# Patient Record
Sex: Female | Born: 1950 | Race: White | Hispanic: No | Marital: Married | Smoking: Current every day smoker
Health system: Southern US, Community
[De-identification: ages and names within clinical notes are randomized; demographics above are authoritative.]

## PROBLEM LIST (undated history)

## (undated) DIAGNOSIS — F32A Depression, unspecified: Secondary | ICD-10-CM

## (undated) DIAGNOSIS — I219 Acute myocardial infarction, unspecified: Secondary | ICD-10-CM

## (undated) DIAGNOSIS — Q444 Choledochal cyst: Secondary | ICD-10-CM

## (undated) DIAGNOSIS — N183 Chronic kidney disease, stage 3 unspecified: Secondary | ICD-10-CM

## (undated) DIAGNOSIS — K635 Polyp of colon: Secondary | ICD-10-CM

## (undated) DIAGNOSIS — K219 Gastro-esophageal reflux disease without esophagitis: Secondary | ICD-10-CM

## (undated) DIAGNOSIS — G4730 Sleep apnea, unspecified: Secondary | ICD-10-CM

## (undated) DIAGNOSIS — F419 Anxiety disorder, unspecified: Secondary | ICD-10-CM

## (undated) DIAGNOSIS — R278 Other lack of coordination: Secondary | ICD-10-CM

## (undated) DIAGNOSIS — K319 Disease of stomach and duodenum, unspecified: Secondary | ICD-10-CM

## (undated) DIAGNOSIS — I10 Essential (primary) hypertension: Secondary | ICD-10-CM

## (undated) DIAGNOSIS — F329 Major depressive disorder, single episode, unspecified: Secondary | ICD-10-CM

## (undated) DIAGNOSIS — E785 Hyperlipidemia, unspecified: Secondary | ICD-10-CM

## (undated) DIAGNOSIS — J449 Chronic obstructive pulmonary disease, unspecified: Secondary | ICD-10-CM

## (undated) HISTORY — DX: Hyperlipidemia, unspecified: E78.5

## (undated) HISTORY — DX: Gastro-esophageal reflux disease without esophagitis: K21.9

## (undated) HISTORY — DX: Chronic obstructive pulmonary disease, unspecified: J44.9

## (undated) HISTORY — DX: Disease of stomach and duodenum, unspecified: K31.9

## (undated) HISTORY — DX: Acute myocardial infarction, unspecified: I21.9

## (undated) HISTORY — PX: ABDOMINAL HYSTERECTOMY: SHX81

## (undated) HISTORY — DX: Choledochal cyst: Q44.4

## (undated) HISTORY — DX: Sleep apnea, unspecified: G47.30

## (undated) HISTORY — DX: Other lack of coordination: R27.8

## (undated) HISTORY — DX: Depression, unspecified: F32.A

## (undated) HISTORY — DX: Anxiety disorder, unspecified: F41.9

## (undated) HISTORY — PX: INGUINAL HERNIA REPAIR: SUR1180

## (undated) HISTORY — DX: Polyp of colon: K63.5

## (undated) HISTORY — PX: BLADDER SUSPENSION: SHX72

## (undated) HISTORY — DX: Essential (primary) hypertension: I10

---
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
## 2004-05-09 HISTORY — PX: CORONARY STENT PLACEMENT: SHX1402

## 2006-05-09 DIAGNOSIS — C3490 Malignant neoplasm of unspecified part of unspecified bronchus or lung: Secondary | ICD-10-CM

## 2006-05-09 HISTORY — DX: Malignant neoplasm of unspecified part of unspecified bronchus or lung: C34.90

## 2007-05-10 HISTORY — PX: LUNG LOBECTOMY: SHX167

## 2011-07-31 HISTORY — PX: UPPER ESOPHAGEAL ENDOSCOPIC ULTRASOUND (EUS): SHX6562

## 2013-01-16 HISTORY — PX: UPPER ESOPHAGEAL ENDOSCOPIC ULTRASOUND (EUS): SHX6562

## 2015-12-07 HISTORY — PX: ANORECTAL MANOMETRY: SHX298

## 2015-12-28 HISTORY — PX: UPPER ESOPHAGEAL ENDOSCOPIC ULTRASOUND (EUS): SHX6562

## 2015-12-28 HISTORY — PX: ESOPHAGOGASTRODUODENOSCOPY: SHX1529

## 2016-05-09 DIAGNOSIS — K52831 Collagenous colitis: Secondary | ICD-10-CM

## 2016-05-09 HISTORY — DX: Collagenous colitis: K52.831

## 2016-08-18 HISTORY — PX: COLONOSCOPY W/ BIOPSIES: SHX1374

## 2018-01-29 HISTORY — PX: UPPER ESOPHAGEAL ENDOSCOPIC ULTRASOUND (EUS): SHX6562

## 2018-09-07 DIAGNOSIS — C3490 Malignant neoplasm of unspecified part of unspecified bronchus or lung: Secondary | ICD-10-CM

## 2018-09-07 HISTORY — DX: Malignant neoplasm of unspecified part of unspecified bronchus or lung: C34.90

## 2019-03-21 LAB — CLOSTRIDIUM DIFFICILE BY PCR

## 2019-04-09 ENCOUNTER — Encounter: Payer: Self-pay | Admitting: Internal Medicine

## 2019-04-21 ENCOUNTER — Encounter: Payer: Self-pay | Admitting: Gastroenterology

## 2019-04-21 NOTE — Progress Notes (Signed)
Referring Provider:  Bonney Leitz, MD Primary Care Physician:  Bonney Leitz, MD Primary Gastroenterologist:  Dr. Gala Romney  Chief Complaint  Patient presents with  . Diarrhea  . Hemorrhoids    some bleeding    HPI:   Carolyn Drake is a 68 y.o. female presenting today at the request of Dr. Laurena Slimmer, patients oncologist, for colitis.   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  Referred for biofeedback therapy but patient declined.  History of chronic diarrhea.  GI pathogen panel and C. difficile negative in 2017.  Colonoscopy in April 2018 for diarrhea.  Biopsy showed collagenous colitis.  Tried to start patient on Lialda but was not covered by insurance.  Balsalazide prescribed but also not covered by insurance although in chart review, it appeared she tried Balsalazide for 1 week and it caused worsening diarrhea.  Budesonide too expensive, Bentyl not effective.  She had been maintaining on Imodium 6 mg twice daily.  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  This has been followed by EUS since 2013.  Most recent EUS on 01/29/2018 with Dr. Newman Pies.  I am unable to review results but according to Dr. 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  Abdominal US 02/2016 for nausea and vomiting.  No acute findings in the abdomen.   Most recently saw Dr. Arsenio Loader with [facility] on 06/27/18 .  She was given a printed prescription of balsalazide to see if she could obtain this out of [place] in a more affordable way.  Advised her to remain on Imodium 6 mg twice a day, and prescribed Cipro 500 mg twice daily for 14 days for empiric treatment of SIBO.  Reviewed referral noted from Dr. Junius Roads with [facility].  Patient  follows with Dr. 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 Per his note, with treatment, survival is about 1.5 years. She was intolerant to chemotherapy and desired to try immune therapy alone.  She was continued on Keytruda.  Apparently had PET scan in September 2020.  Per oncology notes, this showed stable disease.  Stated the rectal activity could be due to hemorrhoids and colitis.  Note on 03/20/2019 stated colitis worsening with the start of Keytruda.  Beryle Flock was held and prescription of prednisone for 2 weeks given.  C. difficile was also checked and patient was started on Lomotil.  C. difficile was negative.  Note on 04/02/2019 stated patient continued with diarrhea and was using 2 tablets of Lomotil a day.  Reported blood in her stool more on wiping but it also drips from her hemorrhoids.  Pain in the right lower part of abdomen. Nausea improved with Ativan.  Labs at that time with elevated LFTs with plans to hold immune therapy.  Prescription for 60 mg prednisone x2 weeks followed by 40 mg x 1 week and then follow-up in clinic.  Also with development of hypothyroidism with TSH 102 and acute kidney injury with creatinine 1.44.  Patients lomotil was increased to 4 times daily. Plans stated if her labs do not improve in 1 week, would do CT of the abdomen and pelvis.  Today:  Diarrhea is better than it had been. Up until  last couple of weeks, she had no control over it. Dr. Junius Roads took her off imodium and started Lomotil. Last took [place] about 1.5-2 months ago. Completed steroids prescribed by Dr. Junius Roads a couple days ago. States mouth broke out with fungal infection. She has completed 3 different antibiotics and a mouth wash which is helping. Taking lomotil twice a day. Basalazide did not work well in the past. She did not get the prescription filled from Dr. Arsenio Loader that he recommended trying to obtain from [place].   On average, having 2-3 BMs daily. Always mushy to watery.  Prior to 2 weeks ago, she was having a BM every hour to two day and night.  Had been losing weight. Since being on steroids, she has gained about 5 lbs. Admits to bright red blood in the stool. Sometimes just drips in the toilet water. Occurs most every day. Has been present for years. Uses preparation H. This helps some. Stopped bleeding for a short time. Reports hemorrhoids on the inside and outside. No rectal pain. No burning, itching, or stinging in rectum. No prior hemorrhoid treatment. No black stools.   Abdominal pain. Started 6 months ago. RLQ and midline lower abdomen. No identified aggravating or relieving factors. At times pain is 5-6/10, other times it is about 2/10. Pain is always there. Not sure if she has her appendix. Denies urinary symptoms. Urine sometimes gets darker. Denies abdominal swelling, swelling in lower extremities, confusion, yellowing of eyes or skin.  No history of drug use.  No alcohol use.  Intermittent nausea. Occasional vomiting. Ativan helps. Reflux daily on omeprazole BID. Has been on omeprazole for years. No NSAIDs. Denies dysphagia.   Has not repeated labs with Dr. Junius Roads yet to follow-up on LFTs etc. She has appointment with him this week for follow-up.    Past Medical History:  Diagnosis Date  . Adenocarcinoma of lung, stage 4 ([place]) 09/2018  . Anxiety   . Collagenous colitis   . Colon polyp   . COPD (chronic obstructive pulmonary disease) ([place])   . Depression   . 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 GERD (gastroesophageal reflux disease)   . HLD (hyperlipidemia)   . HTN (hypertension)   . Lesion of stomach    followed by routine EUS, last in 2019  . Lung cancer ([place]) 2008   s/p resenction  . Myocardial infarction ([place])   . Sleep apnea     Past Surgical History:  Procedure Laterality Date  . ABDOMINAL HYSTERECTOMY    . BLADDER SUSPENSION    . COLONOSCOPY  2018   [facility]: path: collagenous colitis  .  CORONARY STENT PLACEMENT  2006  . 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 Most recent on 01/29/2018 with Dr. Newman Pies.   . INGUINAL HERNIA REPAIR    . LUNG LOBECTOMY Right 2009   upper lobe    Current Outpatient Medications  Medication Sig Dispense Refill  . albuterol (VENTOLIN HFA) 108 (90 Base) MCG/ACT inhaler Inhale 2 puffs into the lungs every 6 (six) hours as needed.    Marland Kitchen aspirin 81 MG EC tablet Take 1 tablet by mouth daily.    Marland Kitchen atorvastatin (LIPITOR) 40 MG tablet Take 1 tablet by mouth daily.    . baclofen (LIORESAL) 10 MG tablet Take 1 tablet by mouth 3 (three) times daily as needed.    . diphenoxylate-atropine (LOMOTIL) 2.5-0.025 MG tablet Take 1 tablet  by mouth every 6 (six) hours as needed.    Marland Kitchen escitalopram (LEXAPRO) 20 MG tablet Take 1 tablet by mouth daily.    Marland Kitchen gabapentin (NEURONTIN) 600 MG tablet Take 1 tablet by mouth 2 (two) times daily.    Marland Kitchen levothyroxine (SYNTHROID) 25 MCG tablet Take 1 tablet by mouth daily.    Marland Kitchen LORazepam (ATIVAN) 0.5 MG tablet Take 1 tablet by mouth 2 (two) times daily as needed.    . metoprolol tartrate (LOPRESSOR) 50 MG tablet Take 1 tablet by mouth 2 (two) times daily.    Marland Kitchen nystatin (MYCOSTATIN) 100000 UNIT/ML suspension Take 5 mLs by mouth every 6 (six) hours.    Marland Kitchen omeprazole (PRILOSEC) 20 MG capsule Take 1 capsule by mouth 2 (two) times daily.    . promethazine (PHENERGAN) 25 MG tablet Take 1 tablet by mouth every 6 (six) hours as needed.    . rizatriptan (MAXALT-MLT) 10 MG disintegrating tablet Take 1 tablet by mouth as needed.    . traMADol (ULTRAM) 50 MG tablet Take 1 tablet by mouth. 1-2 times daily    . traZODone (DESYREL) 50 MG tablet Take 1 tablet by mouth at bedtime.    . hydrocortisone (ANUSOL-HC) 2.5 % rectal cream Place 1 application rectally 2 (two) times daily. 30 g 1  . pantoprazole (PROTONIX) 40 MG tablet Take 1 tablet (40 mg total) by mouth 2 (two) times daily before a meal. 60 tablet 5   No current  facility-administered medications for this visit.    Allergies as of 04/22/2019 - Review Complete 04/22/2019  Allergen Reaction Noted  . Lisinopril Cough 04/22/2019    Family History  Problem Relation Age of Onset  . Lung cancer Mother   . Congestive Heart Failure Father     Social History   Socioeconomic History  . Marital status: Married    Spouse name: Not on file  . Number of children: Not on file  . Years of education: Not on file  . Highest education level: Not on file  Occupational History  . Not on file  Tobacco Use  . Smoking status: Current Every Day Smoker    Types: Cigarettes  . Smokeless tobacco: Never Used  Substance and Sexual Activity  . Alcohol use: Not Currently  . Drug use: Not Currently  . Sexual activity: Not on file  Other Topics Concern  . Not on file  Social History Narrative  . Not on file   Social Determinants of Health   Financial Resource Strain:   . Difficulty of Paying Living Expenses: Not on file  Food Insecurity:   . Worried About Charity fundraiser in the Last Year: Not on file  . Ran Out of Food in the Last Year: Not on file  Transportation Needs:   . Lack of Transportation (Medical): Not on file  . Lack of Transportation (Non-Medical): Not on file  Physical Activity:   . Days of Exercise per Week: Not on file  . Minutes of Exercise per Session: Not on file  Stress:   . Feeling of Stress : Not on file  Social Connections:   . Frequency of Communication with Friends and Family: Not on file  . Frequency of Social Gatherings with Friends and Family: Not on file  . Attends Religious Services: Not on file  . Active Member of Clubs or Organizations: Not on file  . Attends Archivist Meetings: Not on file  . Marital Status: Not on file  Intimate Partner Violence:   .  Fear of Current or Ex-Partner: Not on file  . Emotionally Abused: Not on file  . Physically Abused: Not on file  . Sexually Abused: Not on file     Review of Systems: Gen: Denies any fever, chills, lightheadedness, pre-syncope, or syncope.   CV: Denies chest pain or heart palpitations.  Resp: Chronic shortness of breath with exertion. None at rest. On 2 L nasal canula at night.  GI: See HPI. GU : Denies urinary burning, urinary frequency, urinary hesitancy MS: Denies joint pain Derm: Denies rash Psych: Admits to depression  Heme: See HPI  Physical Exam: BP (!) 158/83   Pulse 69   Temp (!) 96.9 F (36.1 C) (Temporal)   Ht 5' 3"  (1.6 m)   Wt 121 lb 9.6 oz (55.2 kg)   BMI 21.54 kg/m  General:   Alert and oriented. Pleasant and cooperative. Well-nourished and well-developed.  Head:  Normocephalic and atraumatic. Eyes:  Without icterus, sclera clear and conjunctiva pink.  Ears:  Normal auditory acuity. Lungs:  Clear to auscultation bilaterally. No wheezes, rales, or rhonchi. No distress.  Heart:  S1, S2 present without murmurs appreciated.  Abdomen:  +BS, soft, non-distended.  Mild tenderness to palpation in the right lower quadrant/midline lower abdomen.  No HSM noted. No guarding or rebound. No masses appreciated.  Rectal:  Deferred  Msk:  Symmetrical without gross deformities. Normal posture. Extremities:  Without edema. Neurologic:  Alert and  oriented x4;  grossly normal neurologically. Skin:  Intact without significant lesions or rashes. Psych: Normal mood and affect.  Outside Labs: 04/02/2019 Magnesium 1.8 CMP: Creatinine 1.44 (H), sodium 139, potassium 4.1, chloride 109 (H), glucose 84, calcium 8.9, total protein 6.3 (L), albumin 3.2 (L), total bilirubin 0.4, AST 162 (H), ALT 286 (H), alk phos 111 TSH 102 (H), free T4 0.32 (L) CMP: WBC 9.8, hemoglobin 11.4 (L), MCV 103 (H), MCH 31.5, MCHC 30.6 (L), platelets 230  Prior GI Eval/Procedures (obtained from Dr. Arsenio Loader notes as I am not see results in the system- will request records to verify): EUS 08/03/11: 1.5cm stomach submucosal lesion in the body lesser  curve  EUS 01/16/13: 75m by 160mstomach submucosal lesion in the stomach body greater curve- no change in size  EUS 12/28/15: EGD Findings: 1. A gastric subepithelial lesion measuring about 1.5 cm was seen in the distal gastric body, along the greater curvature of the stomach. Multiple biopsies were obtained 2. An erythematous mucosal fold was seen in the duodenal bulb. Biopsies were Obtained  EUS 01/29/2018 Gastric lesion 15 x 11 mm. 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  Colonoscopy 08/18/16: Normal colonoscopy COLON, "RANDOM," BIOPSY: Collagenous colitis.  Anorectal manometry 12/07/15:  Normal resting anal sphincter pressure with normal squeeze and intact recto-anal inhibitory reflex.  Sensation occurs at 70 cc and urge to defecate occurs at 100 cc, suggesting mild rectal hypersensitivity.  Normal rectal propulsive pressure with paradoxical increase in anal pressure during attempted defecation, which yields a negative recto-anal pressure differential, characteristic of type III dyssynergia.  HBT (dextrose) 12/09/16: No significant change in low level hydrogen or methane production over 2 hours. Bacterial overgrowth is not present.

## 2019-04-22 ENCOUNTER — Encounter: Payer: Self-pay | Admitting: Gastroenterology

## 2019-04-22 ENCOUNTER — Ambulatory Visit (INDEPENDENT_AMBULATORY_CARE_PROVIDER_SITE_OTHER): Payer: Medicare Other | Admitting: Gastroenterology

## 2019-04-22 ENCOUNTER — Encounter: Payer: Self-pay | Admitting: *Deleted

## 2019-04-22 ENCOUNTER — Other Ambulatory Visit: Payer: Self-pay

## 2019-04-22 DIAGNOSIS — R109 Unspecified abdominal pain: Secondary | ICD-10-CM | POA: Insufficient documentation

## 2019-04-22 DIAGNOSIS — K219 Gastro-esophageal reflux disease without esophagitis: Secondary | ICD-10-CM

## 2019-04-22 DIAGNOSIS — R7989 Other specified abnormal findings of blood chemistry: Secondary | ICD-10-CM

## 2019-04-22 DIAGNOSIS — K625 Hemorrhage of anus and rectum: Secondary | ICD-10-CM

## 2019-04-22 DIAGNOSIS — R197 Diarrhea, unspecified: Secondary | ICD-10-CM

## 2019-04-22 MED ORDER — HYDROCORTISONE (PERIANAL) 2.5 % EX CREA: 1.0000 "application " | TOPICAL_CREAM | Freq: Two times a day (BID) | CUTANEOUS | 1 refills | Status: DC

## 2019-04-22 MED ORDER — PANTOPRAZOLE SODIUM 40 MG PO TBEC: 40.0000 mg | DELAYED_RELEASE_TABLET | Freq: Two times a day (BID) | ORAL | 5 refills | Status: DC

## 2019-04-22 NOTE — Patient Instructions (Addendum)
I am placing an order for an ultrasound of your liver and gallbladder.  You should have this done in the next week or so.  Please stop omeprazole and start Protonix. I am sending in Protonix 40 mg.  You will take this twice daily 30 minutes before breakfast and dinner.  I am sending in a prescription for Anusol cream.  You will apply this twice daily per your rectum for 10-14 days for your hemorrhoids.  I am requesting records from [facility] health and Dr. Elwin Sleight.  I am going to discuss your case further with Dr. Gala Romney. Further recommendations to follow.   Aliene Altes, PA-C [facility]

## 2019-04-25 ENCOUNTER — Encounter: Payer: Self-pay | Admitting: Gastroenterology

## 2019-04-25 DIAGNOSIS — R7989 Other specified abnormal findings of blood chemistry: Secondary | ICD-10-CM | POA: Insufficient documentation

## 2019-04-25 NOTE — Assessment & Plan Note (Addendum)
Labs on 04/02/2019 with AST elevated at 162 and ALT elevated at 286.  Alk phos normal at 111.  Total bilirubin normal at 0.4.  Suspect this is likely secondary to Keytruda (immune therapy with side effects of AST, ALT, alk phos increase, and hepatitis) which patient was recently started on in June 2020 for recurrent stage IV bilateral lung adenocarcinoma.  LFTs in November 2019 were normal.  No recent imaging of the liver on file visible to me.  She did have PET scan in September 2020 that I cannot view.  Per oncology notes, this showed stable disease.  She denies history of liver disease.  She is without signs or symptoms of advanced liver disease.  Denies history of drug use or alcohol use.  Per last oncology note on 04/02/2019, plans were to hold Keytruda and if labs not improving would order CT abdomen and pelvis. Patient has follow-up with oncology this week and will have labs at that time.   Order RUQ US Follow-up on labs from Dr. Madan.  

## 2019-04-25 NOTE — Assessment & Plan Note (Signed)
Chronic. Not adequately controlled on omeprazole 20 mg BID for which she has been on for years. Will stop omeprazole and start protonix 40 mg BID.

## 2019-04-25 NOTE — Assessment & Plan Note (Signed)
Patient reports intermittent bright red blood per rectum for years with known hemorrhoids.  Currently, bright red blood in stool and occasionally drips into the toilet water occurring most every day.  Using Preparation H which helps some.  Denies rectal pain, burning, itching, or melena.  Admits to constant right lower quadrant and midline lower abdominal pain x6 months without aggravating or relieving factors. She does have history significant for collagenous colitis with colonoscopy in 2018 with [facility] (can not reviewed full report).  Also with recurrent stage IV bilateral lung cancer diagnosed in May 2020.  Oncology notes state with treatment, survival is about 1.5 years with treatment and less than 6 months without treatment.  Currently on immunotherapy alone as she did not tolerate chemo.  She did have a PET scan in September 2020; I cannot see this report.  Per oncology notes, this showed stable disease.  Stated the "rectal activity could be due to hemorrhoids and colitis."   Suspect rectal bleeding is likely due to hemorrhoids at this is a chronic problem and she had colonoscopy in 2018.  Would not expect this to be from her collagenous colitis.  Cannot rule out metastatic disease.  Discussed ordering CT abdomen and pelvis to evaluate her right lower quadrant pain.  Patient states she supposed to have PET scan in December and wants to follow-up with Dr. Junius Roads this week before ordering other scans.  Sending in prescription for Anusol cream.  She is to use this twice daily x10-14 days and as needed. Request records from [facility] and Dr. Junius Roads.  Will discuss case further with Dr. Gala Romney and have further recommendations to follow.

## 2019-04-25 NOTE — Assessment & Plan Note (Addendum)
History of chronic diarrhea in the setting of collagenous colitis previously followed by [facility].  Colonoscopy in 2018 with [facility] with biopsies confirming collagenous colitis.  Cannot see the full report.  Historically this has been difficult to manage.  Reviewed GI notes.  Apparently budesonide too expensive, Bentyl not effective, Lialda not covered by insurance, balsalazide not well covered by insurance although patient apparently tried this in the past and states it did not work well.  Had been maintaining on Imodium.  More recently, Dr. Junius Roads, patients's oncologist, stopped Imodium and started patient on Lomotil.  This was working fairly well until she started [facility] for recurrent bilateral stage IV adenocarcinoma of the lung which worsened her colitis.  C. difficile was negative.  Beryle Flock was held and patient completed 3 weeks of steroids.  Prior to steroids, patient was having watery BMs every 1-2 hours day and night.  Since steroids, diarrhea is much improved only having 2-3 mushy to watery BMs daily and using Lomotil twice daily. Was losing weight, but has gained 5 lbs since steroids. Admit to chronic intermittent rectal bleeding, currently occurring about every day with Preparation H helping some.  Also with 61-month history of constant right lower quadrant, mid lower abdominal pain as discussed above.  PET scan and September 2020.  Cannot review results but per oncology notes this showed stable disease.  They did state rectal activity could be due to hemorrhoids and colitis. Recent hemoglobin on 04/02/19, 11.4 (up from 9 in July). She is supposed to have labs with Dr. Junius Roads this week.    Suspect diarrhea is likely secondary to her collagenous colitis especially as this has improved with steroids. Also influenced by [facility]. Query whether she may benefit from additional budesonide therapy.  We may be able to get this compounded at a lower price at [facility].  Rectal  bleeding likely secondary to known hemorrhoids as this is chronic; however, with recurrent lung cancer and right lower quadrant pain, metastatic disease is on the differential.  Discussed CT abdomen and pelvis with patient, but she is to have a PET scan later this month and wants to follow-up with Dr. Junius Roads this week before ordering other scans. Not sure of the role of repeat colonoscopy at this time. Will discuss case further with Dr. Gala Romney.  Anusol cream BID x 10-14 days for rectal bleeding.  Continue Lomotil.  Request records from Dr. Junius Roads including PET scan results and labs this week.  Request records from [facility].  Further recommendations to follow.

## 2019-04-25 NOTE — Assessment & Plan Note (Addendum)
48-month history of constant right lower quadrant and midline lower abdominal pain without identified aggravating or relieving factors.  Ranges from 2/10 in severity to 5-6/10.  Not sure if she has her appendix.  Denies urinary symptoms.  She does have chronic diarrhea with known collagenous colitis s/p recent steroids with improvement.  Chronic intermittent rectal bleeding with known hemorrhoids per patient, currently occurring with most bowel movements.  Preparation H helping some.  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  Abdominal exam with mild tenderness to palpation in the RLQ/midline lower abdomen.   As this has been going on for 6 months, would not expect an acute etiology such as appendicitis.  With history of recurrent stage IV lung cancer, there is some concern for metastatic disease.  Discussed CT abdomen and pelvis with patient.  She states she has PET scan later this month and wants to follow-up with Dr. Junius Roads this week before pursuing other scans.  She was advised to continue to monitor her symptoms and to let us know if her abdominal pain worsened.

## 2019-04-26 ENCOUNTER — Telehealth: Payer: Self-pay | Admitting: Gastroenterology

## 2019-04-26 NOTE — Telephone Encounter (Signed)
Manuela Schwartz, I see we requested records from [facility]. I am also needing records from Dr. Junius Roads who is with [facility] in [place], [place]. Need patients recent lab work and imaging records including PET scan.

## 2019-04-29 ENCOUNTER — Encounter: Payer: Self-pay | Admitting: Gastroenterology

## 2019-04-30 ENCOUNTER — Telehealth: Payer: Self-pay | Admitting: Gastroenterology

## 2019-04-30 ENCOUNTER — Encounter: Payer: Self-pay | Admitting: Internal Medicine

## 2019-04-30 NOTE — Telephone Encounter (Signed)
Discussed case with Dr. Gala Romney. Recommended treating collagenous colitis with 9 mg Budesonide daily x 8 weeks and follow up in clinic to discuss taper. Suspect rectal bleeding will improve with improvement of diarrhea. No colonoscopy at this time.   Spoke with patient. Would not be able to afford budesonide through insurance. Discussed obtaining compounded Budesonide from [place]. Discussed price of about $60 for 4 weeks, so total of about $120. Patient was agreeable.   Called [employer]. They will compound Budesonide. Prescription for 9 mg daily x 8 weeks. Cost will be about $100.   Stacey: Can you make patient a follow-up appointment for week of February 15th 2021? F/U on collagenous colitis.

## 2019-04-30 NOTE — Telephone Encounter (Signed)
Noted  

## 2019-04-30 NOTE — Telephone Encounter (Signed)
Patient's daughter called to discuss further. Carolyn Drake transferred call to me. Spoke with patient's daughter, Carolyn Drake. She was concerned about the cost of the Budesonide. States her mom can't hear well and wanted to call and discuss the need for Budesonide and the cost. I explained to the daughter that the patient has never been adequately treated for collagenous colitis and Budesonide was the best choice in therapy as she had been tried on other medications in the past without improvement. Explained that Budesonide may not be something that is a chronic medication but we would treat for 8 weeks, reevaluate, and hopefully plan to taper the dose if she responded well. Daughter stated patient will likely restart Keytruda soon and was concerned her diarrhea would worsen. This is a possibility, but we need to go ahead and get on top of treatment so hopefully her diarrhea doesn't get as severe as it did in the past with Keytruda. Daughter stated she will discuss with her mom further. If they decide to move forward with picking up budesonide, they will let us know.   Hilliard Clark

## 2019-04-30 NOTE — Telephone Encounter (Signed)
PATIENT SCHEDULED FOR THAT WEEK AND LETTER SENT

## 2019-04-30 NOTE — Telephone Encounter (Signed)
I've faxed another request from records

## 2019-05-06 ENCOUNTER — Ambulatory Visit (HOSPITAL_COMMUNITY): Admission: RE | Admit: 2019-05-06 | Payer: Medicare Other | Source: Ambulatory Visit

## 2019-05-12 ENCOUNTER — Telehealth: Payer: Self-pay | Admitting: Gastroenterology

## 2019-05-12 NOTE — Telephone Encounter (Addendum)
Received and reviewed labs and recent PET scan from Dr. Junius Roads (oncologist)   Labs: CMP: BUN 18, creatinine 1.08 (H), sodium 136, potassium 3.7, glucose 102, calcium 8.9, total protein 6.2 (L), albumin 6.3, total bilirubin 0.5, AST 29, ALT 106 (H), alk phos 91 CBC: WBC 9.87, hemoglobin 11.3 (L), hematocrit 36.0, MCV 100.8 (H), MCH 31.7, MCHC 31.4, platelets 232 Magnesium 1.8 TSH 22.4 (H) Free T4 0.42 (L)  PET/CT skull-mid thigh 01/23/2019 Impression: Stable appearing foci of moderate to intense FDG avidity within the bilateral upper lobes corresponded with bilateral solid and groundglass opacities.  These are likely secondary to malignancy.  Nonspecific intense uptake within the anterior tongue, likely odontogenic in nature.  New nonspecific contiguous intense FDG uptake at the level of the rectum.  This could be related to bowel uptake or secondary to inflammation or infection.  LFTs are much improved compared to November's labs (AST 162, ALT 286, alk phos 111).  As discussed at office visit, I suspect her elevated LFTs were likely secondary to [facility] which was being held and patient received steroids.  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 No need for significant workup at this time. I did order RUQ [place] which patient hasn't completed.   Rectal activity on PET scan may be related to colitis/ possible hemorrhoids.   Alicia: Please let patient know I have received and reviewed labs/PET scan/recent oncology note. LFTs improving. Elevation likely secondary to [facility]. I did order RUQ [place] which she hasn't completed.   Additionally, I had prescribed budesonide at last visit. Daughter had called with concerns about cost/need for medication (see telephone note). Can we follow-up on whether she picked up this prescription? She really needs to be on this medication  to treat collagenous colitis as this may also correspond to the increased rectal uptake on PET scan.

## 2019-05-13 ENCOUNTER — Telehealth: Payer: Self-pay | Admitting: Internal Medicine

## 2019-05-13 NOTE — Telephone Encounter (Signed)
Lmom waiting on a return call.

## 2019-05-13 NOTE — Telephone Encounter (Signed)
Is there any assistance/discount available for Entocort/budesonide?

## 2019-05-13 NOTE — Telephone Encounter (Signed)
Pt was returning call. [phone]

## 2019-05-13 NOTE — Telephone Encounter (Signed)
Pt returned call. Pt notified of results and [facility]'s recommendations. Pt hasn't picked up the medication and states she isn't able to afford the price of the medication from [place].

## 2019-05-22 NOTE — Telephone Encounter (Signed)
Looking into pt assistance for pt. Pt isn't able to pay the copay and states it's going to be a lot of work trying to find proof of income. Will call pts daughter to discuss with her.

## 2019-05-23 NOTE — Telephone Encounter (Signed)
Noted. Left a detailed message in reference to [facility] recommendations. We will wait to hear back from [facility] pt assistance program.

## 2019-05-23 NOTE — Telephone Encounter (Signed)
I've spoken with [facility] Pt assistance program and pt has been put on a waiting list due to their company running out of funds. I was given the number [phone] and pt would have to apply through them and it would be around %50 for 30 pills. I've spoken with pts daughter twice and the $100 for 2 months is too expensive for them to pay due to pts other medical expenses. I'm not sure what can be done in reference to pts medication.

## 2019-05-23 NOTE — Telephone Encounter (Signed)
Noted. There isn't really a good alternative to budesonide for treatment of collagenous colitis. As her diarrhea was mild at her office visit, I would recommend continuing Lomotil as needed. I would also advise she try taking 1 tums just before each meal to see if this will also help with diarrhea. Otherwise, she should apply through [facility] Pt assistance to see if Budesonide would be more affordable once their funds become available.

## 2019-05-27 ENCOUNTER — Ambulatory Visit (HOSPITAL_COMMUNITY)
Admission: RE | Admit: 2019-05-27 | Discharge: 2019-05-27 | Disposition: A | Discharge status: Home / Self Care | Payer: Medicare Other | Source: Ambulatory Visit | Attending: Gastroenterology | Admitting: Gastroenterology

## 2019-05-27 ENCOUNTER — Other Ambulatory Visit: Payer: Self-pay

## 2019-05-27 DIAGNOSIS — R109 Unspecified abdominal pain: Secondary | ICD-10-CM | POA: Insufficient documentation

## 2019-05-31 ENCOUNTER — Telehealth: Payer: Self-pay | Admitting: Internal Medicine

## 2019-05-31 NOTE — Telephone Encounter (Signed)
PATIENT DAUGHTER CALLED INQUIRING ABOUT PT UTRASOUND RESULTS, SAID HER MOTHER CAN NOT HEAR IF SHE WAS CALLED.

## 2019-05-31 NOTE — Telephone Encounter (Signed)
Lmom, waiting on a return call.  

## 2019-06-03 NOTE — Progress Notes (Signed)
Spoke with Dr. Junius Roads who feels patient would not be a surgical candidate. Feels less is more for patient at this time considering her other comorbidities. He agrees that continuing to monitor for development of biliary symptoms is the best management for now. Consider MRI in 6 months depending on her health status and if she is a surgical candidate at that time. He states he will continue to monitor this locally and if MRI is needed, he will order as patient is already seeing him every 2-3 weeks.   Called patient. She stated she was unable to hear well and preferred I speak with her daughter, Eleisha Branscomb. Relayed results along with Dr. Roseanne Kaufman and Dr. Hali Marry  recommendations to Mrs. Riddick. Daughter also feels monitoring is the most appropriate step at this time. She will relay results to her mom and they will call with any further questions or concerns. Discussed monitoring for RUQ pain, jaundice, fever, chills, or confusion.   Manuela Schwartz, please fax [place] report to Dr. Hali Marry office.

## 2019-06-06 NOTE — Telephone Encounter (Signed)
Spoke with pts daughter. She has spoken with [facility] about the results and thanked our office for calling.

## 2019-06-23 NOTE — Progress Notes (Signed)
Referring Provider: [place]* Primary Care Physician:  Kendrick Ranch, MD Primary GI Physician: Dr. Gala Romney  Chief Complaint  Patient presents with   Diarrhea    colitis,nausea    HPI:   Carolyn Drake is a 69 y.o. female presenting today for follow-up of collagenous colitis.  Historically, patient had been followed by [facility] health.  She has history of chronic diarrhea. HBT (dextrose) 12/09/16 negative. Colonoscopy in April 2018 revealed collagenous colitis.   She had been maintaining on Imodium and/or Lomotil due to inability to afford budesonide.  Bentyl apparently not effective.  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  Most recent EUS on 01/19/2018 by Dr. Newman Pies.  Recommendations to return in 4 years for EUS for surveillance.  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    Patient was last seen in our office on 04/21/2020 at the request of Dr. Laurena Slimmer for colitis.  Patient had worsening diarrhea while on Keytruda.  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  Beryle Flock was held and patient was prescribed prednisone for 2 weeks.  C. difficile was checked and this was negative. Dr. Junius Roads started her on Lomotil.  At the time of her office visit, diarrhea had improved s/p prednisone.  Taking Lomotil twice a day and having 2-3 mushy-watery bowel movements a day.  Prior to this, she had been having BMs about every 1-2 hours.  She had been off of [place] for about 2 months.  Generally, she reported several year history of bright red blood per rectum secondary to hemorrhoids using Preparation H.  Also with abdominal pain in the RLQ without identified aggravating or relieving factors.  Continued with  daily reflux symptoms on omeprazole twice daily.  Plans to discuss role of early interval colonoscopy with Dr. Gala Romney.  Otherwise, would attempt to obtain compounded budesonide from [facility], start Anusol cream twice daily x10-14 days for rectal bleeding, continue Lomotil, discussed CT abdomen and pelvis with patient to evaluate right lower quadrant pain rule out metastatic disease; however, she declined and preferred to discuss with Dr. Junius Roads.  RUQ ultrasound for elevated LFTs, follow-up on labs to be completed with Dr. Junius Roads later that week. Change omeprazole to Protonix 40 mg BID.   Discussed case with Dr. Gala Romney.  Suspected diarrhea was secondary to collagenous colitis and recommended budesonide 9 mg daily for 8 weeks and follow-up thereafter.  Suspected rectal bleeding would improve with improvement of diarrhea.  No role for colonoscopy this time.  Ultimately, patient unable to afford budesonide.  Tried to arrange for patient to obtain budesonide through [facility] Patient Assistance.  She was put on the waiting list due to company running out of funds.  She is advised to continue Lomotil as needed and add 1 Tums with meals.  Received labs completed with Dr. Junius Roads on 04/23/19.  total bilirubin 0.5, AST 29, ALT 106 (H), alk phos 91 which was much improved from November's labs with AST 162, ALT 286, alk phos 111.  Plans to resume Keytruda with possibility of autoimmune hepatitis recurring.  RUQ ultrasound completed 05/27/2019.  Liver and gallbladder within normal limits.  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  Discussed findings with Dr. Gala Romney and Dr. Junius Roads. As patient was without symptoms, recommended  we continue to monitor and consider MRCP in the future if patient developed symptoms or if she became a surgical candidate as treatment of type I choledochal cyst is surgery. Dr. Junius Roads stated patient would not be a surgical candidate at this time. He stated he would continue to monitor locally and if MRI  is needed, he would order as he already sees patient every 2-3 weeks.  Today:   Diarrhea: Having 3-4 watery stools a day. Taking 3 Lomotil a day. Not sure if she spoke with or applied for Budesonide with [facility]. Her daughter takes care of this. Started taking 1 tums with meals. Feels this helps some. Doesn't want to try Bentyl again. States she was on this in the past and didn't feel like it helped much. Discussed trying to get help from community, family, friends, local church to help with budesonide cost. Patient doesn't feel she can do this. Overall, symptoms are still much better than they were a few months ago when she was having diarrhea every hour to two hours.   Rectal bleeding with frequent BMs. Rectal bleeding is not every day. This has improved with Anusol cream. She will apply BID when bleeding occurs and rectal bleeding resolves. Reports known hemorrhoids that prolapse at times. She would like to have a banding if possible. Denies melena.   GERD: Improved. Still with symptoms at least twice a week but not as intense. Taking Protonix BID. No dysphagia.   RUQ and RLQ pain. Present for several months without significant change. Daily. Pain associated with bowel movements. Improves after a BM but doesn't completely go away until about 30 minutes to an hour later. Denies association with meals but states she typically has a BM about 40 minutes to an hour after meals. Quality is sharp. Can get "pretty bad."  Associated nausea.   Chronic intermittent nausea. No vomiting. No longer taking Ativan due to hallucinations. Phenergan does ok. Thinks she may have taken Zofran a long time ago. Can't remember how this worked. Interested in trying this. Admits to early satiety which is chronic.   States she was sick in January and admitted at [facility] for dehydration. Had a CT scan. Not sure of results. Doesn't remember being told of any significant findings.    Had 2 treatments with Keytruda since I  saw her. None since the first part of January due to thyroid abnormalities and decline in kidney function. States her lung cancer is stable.   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   She was on antibiotics for "a while" for dental work in January.  Denies fever, chills, presyncope, syncope, chest pain, heart palpitations.  Admits to shortness of breath with exertion but none at rest.  Occasional chronic cough.  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 Plans to monitor for 6 months. Consider MRI/MRCP in 6 months or if symptomatic.    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 Lissa Morales notes [facility]): Normal resting anal sphincter pressure with normal squeeze and intact anorectal inhibitory reflex.  Mild rectal hypersensitivity.  Findings characteristic of type III dyssynergia.   BLADDER SUSPENSION     COLONOSCOPY W/ BIOPSIES  08/18/2016   [facility] Anesthesia; Surgeon: Dr. Arsenio Loader; No polyps, mucosal inflammation and mucosa appeared normal. S/p random biopsies to r/o collagenous colitis. Pathology consistent with collagenous colitis. Recommended repeat TCS in 10 years.    CORONARY STENT PLACEMENT  2006   ESOPHAGOGASTRODUODENOSCOPY  12/28/2015   [facility] Anesthesia; Surgeon: Dr. Harley [facility]); GAstric  subepithelial lesion about 1.5 cm in distal gastric body along greater curvature of the stomach s/p biopsies. Erythematous mucosal fold in duodenal bulb s/p biopsy. Path: benign stomach biopsies , no H. pylori or malignancy, benign duodenal biopsy.    INGUINAL HERNIA REPAIR     LUNG LOBECTOMY Right 2009   upper lobe   UPPER ESOPHAGEAL ENDOSCOPIC ULTRASOUND (EUS)  01/29/2018   [facility]; 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 Repeat in 4 years for surveillance.    UPPER ESOPHAGEAL ENDOSCOPIC ULTRASOUND (EUS)  12/28/2015   [facility] Anesthesia; Surgeon: Dr. Harley Hallmark [facility]); 1.2cm x 1.6 cm gastric lesion. Leiomyoma vs GIST.    UPPER ESOPHAGEAL ENDOSCOPIC ULTRASOUND (EUS)  07/31/2011   Information obtained from Dr. Lissa Morales notes [facility]): 1.5 stomach submucosal lesion in the gastric body   UPPER ESOPHAGEAL ENDOSCOPIC ULTRASOUND (EUS)  01/16/2013   Information obtained from Dr. Lissa Morales notes [facility]): 15 mm x 12 mm stomach submucosal lesion in the stomach body greater curve-no change in size.    Current Outpatient Medications  Medication Sig Dispense Refill   albuterol (VENTOLIN HFA) 108 (90 Base) MCG/ACT inhaler Inhale 2 puffs into the lungs every 6 (six) hours as needed.     aspirin 81 MG EC tablet Take 1 tablet by mouth daily.     atorvastatin (LIPITOR) 40 MG tablet Take 1 tablet by mouth daily.     diphenhydrAMINE (BENADRYL) 25 mg capsule Take 25 mg by mouth as needed.     diphenoxylate-atropine (LOMOTIL) 2.5-0.025 MG tablet Take 1 tablet by mouth every 6 (six) hours as needed.     hydrocortisone (ANUSOL-HC) 2.5 % rectal cream Place 1 application rectally 2 (two) times daily. 30 g 1   levothyroxine (SYNTHROID) 25 MCG tablet Take 1 tablet by mouth 2 (two) times daily.      metoprolol tartrate (LOPRESSOR) 50 MG tablet Take 1 tablet by mouth 2 (two) times daily.     nitroGLYCERIN (NITROSTAT) 0.4 MG SL tablet  Place 0.4 mg under the tongue every 5 (five) minutes as needed for chest pain.     pantoprazole (PROTONIX) 40 MG tablet Take 1 tablet (40 mg total) by mouth 2 (two) times daily before a meal. 60 tablet 5   promethazine (PHENERGAN) 25 MG tablet Take 1 tablet by mouth every 6 (six) hours as needed.     traZODone (DESYREL) 50 MG tablet Take 1 tablet by mouth at bedtime.     dexlansoprazole (DEXILANT) 60 MG capsule Take 1 capsule (60 mg total) by mouth daily. 90 capsule 3   ondansetron (ZOFRAN) 4 MG tablet Take 1 tablet (4 mg total) by  mouth every 8 (eight) hours as needed for nausea or vomiting. 30 tablet 1   Pembrolizumab (KEYTRUDA IV) Inject into the vein. Currently on hold due to renal and thyroid abnormalities.     No current facility-administered medications for this visit.    Allergies as of 06/24/2019 - Review Complete 06/24/2019  Allergen Reaction Noted   Lisinopril Cough 04/22/2019    Family History  Problem Relation Age of Onset   Lung cancer Mother    Congestive Heart Failure Father     Social History   Socioeconomic History   Marital status: Married    Spouse name: Not on file   Number of children: Not on file   Years of education: Not on file   Highest education level: Not on file  Occupational History   Not on file  Tobacco Use   Smoking status: Current Every Day Smoker    Packs/day: 0.50    Types: Cigarettes   Smokeless tobacco: Never Used  Substance and Sexual Activity   Alcohol use: Not Currently   Drug use: Not Currently   Sexual activity: Not on file  Other Topics Concern   Not on file  Social History Narrative   Not on file   Social Determinants of Health   Financial Resource Strain:    Difficulty of Paying Living Expenses: Not on file  Food Insecurity:    Worried About [place] in the Last Year: Not on file   Ran Out of Food in the Last Year: Not on file  Transportation Needs:    Lack of Transportation  (Medical): Not on file   Lack of Transportation (Non-Medical): Not on file  Physical Activity:    Days of Exercise per Week: Not on file   Minutes of Exercise per Session: Not on file  Stress:    Feeling of Stress : Not on file  Social Connections:    Frequency of Communication with Friends and Family: Not on file   Frequency of Social Gatherings with Friends and Family: Not on file   Attends Religious Services: Not on file   Active Member of Clubs or Organizations: Not on file   Attends Archivist Meetings: Not on file   Marital Status: Not on file    Review of Systems: Gen: See HPI CV: See HPI Resp: See GPI GI: See HPI Derm: Denies rash Heme: See HPI  Physical Exam: BP 104/62    Pulse 65    Temp (!) 96.8 F (36 C) (Temporal)    Ht _0  (1.6 m)    Wt 113 lb 12.8 oz (51.6 kg)    BMI 20.16 kg/m  General:   Alert and oriented. No distress noted. Pleasant and cooperative.  Head:  Normocephalic and atraumatic. Eyes:  Conjuctiva clear without scleral icterus. Ears: Decreased auditory acuity.  Heart:  S1, S2 present without murmurs appreciated. Lungs:  Clear to auscultation bilaterally. No wheezes, rales, or rhonchi. No distress.  Abdomen:  +BS, soft, and non-distended. Mild tenderness to palpation in the RUQ and RLQ. No rebound or guarding. No HSM or masses noted. Msk:  Symmetrical without gross deformities. Normal posture. Extremities:  Without edema. Neurologic:  Alert and  oriented x4 Psych:  Normal mood and affect.

## 2019-06-24 ENCOUNTER — Encounter: Payer: Self-pay | Admitting: Gastroenterology

## 2019-06-24 ENCOUNTER — Ambulatory Visit (INDEPENDENT_AMBULATORY_CARE_PROVIDER_SITE_OTHER): Payer: Medicare Other | Admitting: Gastroenterology

## 2019-06-24 ENCOUNTER — Other Ambulatory Visit: Payer: Self-pay

## 2019-06-24 VITALS — BP 104/62 | HR 65 | Temp 96.8°F | Ht 63.0 in | Wt 113.8 lb

## 2019-06-24 DIAGNOSIS — K219 Gastro-esophageal reflux disease without esophagitis: Secondary | ICD-10-CM

## 2019-06-24 DIAGNOSIS — R197 Diarrhea, unspecified: Secondary | ICD-10-CM

## 2019-06-24 DIAGNOSIS — R109 Unspecified abdominal pain: Secondary | ICD-10-CM

## 2019-06-24 DIAGNOSIS — K625 Hemorrhage of anus and rectum: Secondary | ICD-10-CM | POA: Diagnosis not present

## 2019-06-24 DIAGNOSIS — R110 Nausea: Secondary | ICD-10-CM

## 2019-06-24 DIAGNOSIS — R7989 Other specified abnormal findings of blood chemistry: Secondary | ICD-10-CM

## 2019-06-24 MED ORDER — DEXLANSOPRAZOLE 60 MG PO CPDR: 60.0000 mg | DELAYED_RELEASE_CAPSULE | Freq: Every day | ORAL | 3 refills | Status: DC

## 2019-06-24 MED ORDER — ONDANSETRON HCL 4 MG PO TABS: 4.0000 mg | ORAL_TABLET | Freq: Three times a day (TID) | ORAL | 1 refills | Status: DC | PRN

## 2019-06-24 NOTE — Patient Instructions (Addendum)
Please have stool studies completed.  I am requesting records from [place].  I will reach back out to you regarding your abdominal pain once I have these records to review.  Should your abdominal pain worsen, become persistent, you should proceed to the emergency room.  For your reflux, I am sending in Dexilant 60 mg.  You will take this once daily.  When you picked this up, stop Protonix.  Please let me know if Dexilant is not affordable.  Follow GERD diet.  Avoid fried, fatty, greasy, spicy, citrus foods.  Avoid caffeine, carbonated beverages, and chocolate.  Do not eat within 3 hours of laying down.  See handout below.  For your diarrhea, continue taking Lomotil as needed and 1 Tums with meals.  Please reach out to [facility] patient assistance regarding budesonide to see where you are on the waiting list.  For your nausea, I am sending in Zofran 4 mg.  You can take this every 8 hours as needed for nausea/vomiting.  Do not take this with Phenergan.  We will get you scheduled for a possible hemorrhoid banding in 4-6 weeks.  Continue using Anusol (hydrocortisone) cream twice daily for 7-10 days as needed.  Follow-up in 2-3 months with Dr. Almetta Lovely, [facility]

## 2019-06-25 NOTE — Assessment & Plan Note (Addendum)
Chronic.  Known history of collagenous colitis diagnosed at time of colonoscopy in 2018 with [facility].  Currently with 3-4 watery stools a day. Mild increase in number of stools from 2 a day when I saw her in December 2020. Overall improved after a course of prednisone a couple months ago. Unable to afford Budesonide thus far. Currently on waiting list with [facility] Patient [employer]. Currently taking 3 Lomotil a day and added 1 Tums with meals which she feels is helping. She does note being on antibiotics for "a while" in January for dental work. Additional symptoms include intermittent chronic rectal bleeding in the setting of frequent stools with known hemorrhoids that prolapse at times.  Also with several month history of RLQ and RUQ abdominal pain that seems to be somewhat associated with bowel movements but does not always resolve thereafter. Abdominal pain discussed below. Reports CT at [facility] in January 2020.  I do not have these records.  Abdominal exam with tenderness to palpation in the RLQ and RUQ.  Suspect patient's chronic diarrhea is secondary to inadequately treated collagenous colitis.  However, she was recently on antibiotics, so we will check her stool to rule out possible infectious etiology contributing to chronic symptoms. Not clear whether abdominal pain is truly associated with BMs. She could have a component of IBS. Also with thyroid function abnormalities related to [facility] (which is now on hold again) that may be playing a role. Of note, discussed case with Dr. Gala Romney after last office visit. He did not recommend early interval colonoscopy. Suspected rectal bleeding would improve with appropriate treatment of diarrhea.  Complete C. difficile and GI pathogen panel. Advised patient to reach out to [facility] patient assistance to see where she is on the waiting list. Discussed trying to get help with budesonide cost from community, family, friends, or local church but patient does not  feel she can do this.  Request records from [facility]. Continue Lomotil as needed. Continue 1 Tums with meals. Discussed adding Bentyl but patient declined. Depending on where patient is on waiting list with [facility] [place], may need to try adding cholestyramine vs Bismuth Subsalicylate. Will await stool studies and reach out to patient to follow-up.  Arrange for possible hemorrhoid banding in 4 weeks with Roseanne Kaufman, NP to help with rectal bleeding.  Follow-up in 2 months with RMR.

## 2019-06-25 NOTE — Assessment & Plan Note (Addendum)
Improved but still with symptoms at least twice a week on Protonix 40 mg twice daily.   Trial Dexilant 60 mg daily. Counseled on GERD diet and lifestyle. Follow-up in 2 months with RMR.

## 2019-06-25 NOTE — Assessment & Plan Note (Addendum)
Patient had acute elevation of LFTs in November 2020 with AST 162, ALT 286, alk phos and total bilirubin normal.  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  Prior to November, LFTs were within normal limits.  Beryle Flock was held, she was treated with steroids by her oncologist, and LFTs improved.  Repeat labs on 04/23/2019 with AST 29, ALT 106, alk phos and total bilirubin normal.  Oncology note on 04/23/2019 with plans to resume Keytruda with possibility of recurrence autoimmune hepatitis. RUQ ultrasound completed on 05/17/2019. 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  Patient was without biliary symptoms at that time. Did not suspect this was contributing to elevated LFTs as alk phos and bilirubin remained normal.  Discussed with Dr. Gala Romney and Dr. Junius Roads (patient's oncologist) who recommended considering MRCP in the future if patient were to develop symptoms or if she were to become a surgical candidate as treatment of type 1 choledochal cyst requires surgery. Dr. Junius Roads stated he would continue to monitor as he sees patient q3 weeks.  Currently with >6 months of right mid abdomen and RLQ pain as a couple months of RUQ pain that seems to worsen prior to BMs but doesn't necessarily resolve thereafter. Associated nausea at times. Abdominal pain addressed above. Reports recent CT at [facility] in January. Recent Labs completed with Dr. Junius Roads. I do not have these results and patient is not aware of LFT trend or CT findings. Physical exam with TTP in RUQ and RLQ. No jaundice, fever, or confusion.    Again, suspect acute LFT elevation likely secondary to autoimmune hepatitis due to [facility].  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  As she is having intermittent RUQ/RLQ abdominal pain associated with nausea at times, we may need  to pursue MRCP. As pain is chronic without significant change, will request CT report from [facility], discuss with Dr. Gala Romney, and have further recommendations to follow. Of note, patient reports decline in renal function. Imaging may be limited to non-contrast. She was advised to let us know if abdominal pain worsened in the mean time. Will also request recent labs from Dr. Junius Roads.  Follow-up in 2 months with RMR.

## 2019-06-25 NOTE — Assessment & Plan Note (Addendum)
Chronic intermittent daily nausea without vomiting as well as chronic early satiety. At times, nausea is associated with right lower quadrant and right upper quadrant abdominal pain that seems to occur prior to watery bowel movements which are typically within 40 minutes to an hour of eating, other times without identified cause.  Currently taking Phenergan a few times a day which seems to do okay.  Historically, Ativan had worked well for cronic nausea but this was discontinued recently due to hallucinations. Thinks she may have taken Zofran in the past and does not remember how well this worked.  She is interested in trying Zofran again. 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  Patient was without significant biliary symptoms at her last visit and is not a surgical candidate (per discussion with Dr. Albertha Ghee oncologists). Plans were to monitor and consider MRCP in the future.  Abdominal exam with RUQ and RLQ tenderness to palpation. No jaundice, fever, or confusion.   Chronic nausea likely influenced by recurrent stage IV bilateral lung cancer.  Would not jump to EGD in this patient.  She may need further evaluation with MRCP as she is now having right upper quadrant pain with associated nausea. She reports having a CT in January while inpatient for dehydration at [facility]. Will plan to request records from [facility], discuss further with Dr. Gala Romney, and trial Zofran 4 mg q 8 hours for nausea. Patient advised not to take this with Phenergan.   Follow-up in 2 months with RMR.

## 2019-06-27 ENCOUNTER — Encounter: Payer: Self-pay | Admitting: Gastroenterology

## 2019-06-27 NOTE — Assessment & Plan Note (Addendum)
Chronic rectal bleeding occurring in the setting of frequent loose/watery stools with known hemorrhoids that prolapse at times. Discussed rectal bleeding with Dr. Gala Romney in December. He did not recommend repeat TCS at this time. Needs better management of diarrhea and rectal bleeding would likely improve. Last TCS in 2018 with [facility].  Currently using Anusol cream as needed which helps. Discussed possible hemorrhoid banding which patient is interested in pursuing.   Follow-up in 4-6 weeks with Roseanne Kaufman for possible hemorrhoid banding. Continue Anusol cream twice daily as needed. Follow-up in 2 months with Dr. Gala Romney.

## 2019-06-27 NOTE — Assessment & Plan Note (Addendum)
69 y.o. 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 Right mid to lower quadrant pain has been present for >6 months. Now reporting RUQ pain present for at least a couple of months. Denies any significant change over the last few months. Pain seems to be brought on or worsens prior to BMs but doesn't always resolve thereafter. Associated nausea, but also with chronic intermittent daily nausea. Reports CT completed at [facility] in January. I do not have the report and patient is unaware of results. Exam with tenderness to palpation in RUQ and RLQ. No jaundice, fever, or confusion.   Differentials include metastatic disease, symptoms related to choledochal cyst, component of IBS, wouldn't expect diverticulitis or appendicitis as symptoms have been present for several months, no fever, and patient reporting recent CT. Discussed choledochal cyst with Dr. Gala Romney and Dr. Junius Roads (patients oncologists) in January. Both providers felt it was appropriate to continue to monitor symptoms as patient wasn't having classic biliary symptoms at that time. Would consider MRCP in the future if she developed symptoms or if she became a surgical candidate as surgery would be the treatment if this was a type 1 cyst. Per Dr. Junius Roads, considering her current state of health with recurrent cancer, she is not a surgical candidate.   As pain is chronic and stable, will request records from [facility] to review CT report. Depending on findings, may need to go ahead and pursue MRCP. Will discuss with Dr. Gala Romney. Patient advised to let us know if pain worsened in the mean time.  Of note, patient reports Beryle Flock is on hold due to decline  in renal function. Imaging would likely have to be limited to non-contrast. Will review labs from [facility] and request recent labs from Dr. Junius Roads.  Follow-up with Dr. Gala Romney in 2 months.

## 2019-06-30 ENCOUNTER — Telehealth: Payer: Self-pay | Admitting: Gastroenterology

## 2019-06-30 NOTE — Telephone Encounter (Signed)
Received and reviewed records from recent hospitalization from 06/04/2019-06/05/2019 as well as recent office visit notes and labs with Dr. Junius Roads.   Patient was admitted for generalized weakness, dehydration, acute kidney injury with creatinine 3.64 on admission, and mild hypokalemia with potassium 3.1. She reported episodes of tremors in her right hand.  CT head and MRI brain negative for acute intracranial findings.  EEG within normal limits.  Also found to have severe hypothyroidism with TSH 122, free T4 0.06 likely secondary to [facility].  She was given IV fluids and creatinine improved to 2.14. Potassium repleted with oral potassium.  Levothyroxine was increased to 50 mcg daily. She was discharged home with home health services.    Patient saw Dr. Junius Roads on 06/11/2019.  Patient reported she had not been taking her thyroid medication.  Overall she felt tired. Repeat labs on 2/2 with creatinine improved to 2.02.  Sodium and potassium within normal limits.  LFTs and total bilirubin within normal limits.  Magnesium slightly low at 1.5.  Hemoglobin stable at 10.9. Plans were to hold Keytruda due to numerous issues including hypothyroidism, autoimmune hepatitis, and colitis.  Labs for hypomagnesemia faxed to [facility] infusion as patient receives magnesium infusions there.  Plan to follow-up in 4 weeks.   At her last office visit with me on 06/24/2019, patient had several month history of RLQ and RUQ pain.  She thought she had CT abdomen and pelvis at the hospital but it turns out CT was of her head.  We need to pursue additional imaging to evaluate this further.  She may need MRCP to evaluate choledochal cyst in relation to RUQ pain; however, would like to start with CT abdomen and pelvis to evaluate RLQ pain as well.  Due to acute kidney injury, we will need to update creatinine to determine whether or not any contrast can be used.   Elmo Putt, please let patient know she only had a CT of her head not her abdomen. We  need to pursue a CT of her abdomen/pelvis to evaluate her abdominal pain but we need to update kidney function first.  Please arrange BMP with GFR. Also, I ordered stool studies at her last visit. Please remind her about having these completed. She may prefer to have all labs completed in [place], [place].

## 2019-07-01 NOTE — Telephone Encounter (Signed)
Spoke with pts daughter. She is going to check with the oncologist since pt has labs done on a weekly basis, to see if a recent BMP was done. Pt also had recent stool studies. Pt's daughter is going to contact me back to see if she is able to get that info. If not, we will change orders for [facility].

## 2019-07-15 NOTE — Telephone Encounter (Signed)
Carolyn Drake, can we follow-up on this? We need updated kidney function to schedule CT and she needs to complete stool studies. If she hasn't completed stool studies since January, she should complete the stool studies I ordered as she was on antibiotics and hospitalized.

## 2019-07-16 ENCOUNTER — Encounter: Payer: Self-pay | Admitting: Gastroenterology

## 2019-07-17 ENCOUNTER — Encounter: Payer: Self-pay | Admitting: Internal Medicine

## 2019-07-17 NOTE — Telephone Encounter (Signed)
Spoke with pt. Pt had recent labs done and her daughter is going to fax the results to our office. Pt's oncologist ordered a CT scan for pt, which was completed on today of the abdomen. The oncologist is also going to prescribe something for pts diarrhea when the results come back because they feel the diarrhea is staying due to pts treatments. Pt's daughter also days she will share the results with our office when they come back. Pt's daughter apologized and state she's sorry she didn't call our office back, pts oncology apts are very important at this time.

## 2019-07-17 NOTE — Telephone Encounter (Signed)
Lm for pts daughter to return my call.

## 2019-07-18 NOTE — Telephone Encounter (Signed)
Noted. Manuela Schwartz, please let me know if any labs or CT results come in for this pt. thanks

## 2019-07-18 NOTE — Telephone Encounter (Signed)
Noted. Will keep a look out for any results we receive.

## 2019-07-19 NOTE — Telephone Encounter (Signed)
I put some in [facility] box this morning.

## 2019-07-19 NOTE — Telephone Encounter (Signed)
Noted  

## 2019-08-08 ENCOUNTER — Encounter: Payer: Medicare Other | Admitting: Gastroenterology

## 2019-08-13 ENCOUNTER — Ambulatory Visit: Payer: Medicare Other | Admitting: Gastroenterology

## 2019-09-04 ENCOUNTER — Encounter: Payer: Medicare Other | Admitting: Gastroenterology

## 2019-09-04 ENCOUNTER — Encounter: Payer: Self-pay | Admitting: Internal Medicine

## 2019-09-04 ENCOUNTER — Telehealth: Payer: Self-pay | Admitting: Internal Medicine

## 2019-09-04 NOTE — Telephone Encounter (Signed)
PATIENT WAS A NO SHOW AND LETTER SENT  °

## 2020-03-18 ENCOUNTER — Other Ambulatory Visit: Payer: Self-pay

## 2020-03-18 ENCOUNTER — Ambulatory Visit (INDEPENDENT_AMBULATORY_CARE_PROVIDER_SITE_OTHER): Payer: Medicare Other | Admitting: Gastroenterology

## 2020-03-18 ENCOUNTER — Encounter: Payer: Self-pay | Admitting: Gastroenterology

## 2020-03-18 VITALS — BP 95/65 | HR 66 | Temp 97.3°F | Ht 63.0 in | Wt 95.2 lb

## 2020-03-18 DIAGNOSIS — R109 Unspecified abdominal pain: Secondary | ICD-10-CM | POA: Diagnosis not present

## 2020-03-18 DIAGNOSIS — K625 Hemorrhage of anus and rectum: Secondary | ICD-10-CM

## 2020-03-18 DIAGNOSIS — K219 Gastro-esophageal reflux disease without esophagitis: Secondary | ICD-10-CM

## 2020-03-18 DIAGNOSIS — K529 Noninfective gastroenteritis and colitis, unspecified: Secondary | ICD-10-CM

## 2020-03-18 MED ORDER — BUDESONIDE 3 MG PO CPEP: 9.0000 mg | ORAL_CAPSULE | Freq: Every day | ORAL | 3 refills | Status: DC

## 2020-03-18 NOTE — Patient Instructions (Signed)
I have printed a prescription for budesonide (Entocort). It is important that we can try to get this covered. Please let me know if the cancer society is not able to help get this covered for you. You will take 3 capsules ( a total of 9 milligrams) daily for the next 6-8 weeks.   Please have blood work completed.  It is important to avoid smoking, Ibuprofen, Advil, Aleve, Motrin, etc.  We are arranging a colonoscopy in the near future!  Further recommendations to follow!  It was a pleasure to see you today. I want to create trusting relationships with patients to provide genuine, compassionate, and quality care. I value your feedback. If you receive a survey regarding your visit,  I greatly appreciate you taking time to fill this out.   Annitta Needs, PhD, ANP-BC [facility]

## 2020-03-18 NOTE — Progress Notes (Signed)
Referring Provider: Jannett Celestine* Primary Care Physician:  Kendrick Ranch, MD Primary GI: Dr. Gala Romney   Chief Complaint  Patient presents with   Diarrhea    daily. Saw [facility] 02/2020 and they recommend endoscopy with flex sig or colonoscopy to assess for worsening colitis   Weight Loss    HPI:   Carolyn Drake is a 69 y.o. 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 Newman Pies (surveillance due 2023). Small type 1 choledochal cyst on RUQ ultrasound Jan 2021, with plans to monitor and consider MRCP in future if symptomatic. Sept 2020 PET scan with nonspecific contiguous intense uptake at level of rectum. Patient declining rectal exam today. [facility] requesting diagnostic colonoscopy.   Weight 121 in Dec 2020, 113 in Feb 2021, and 95 lbs in Nov 2021.   3-4 watery stools per day. Has abdominal cramping. RLQ with discomfort, RUQ with discomfort. No pain after eating. RUQ feels like something has a hold on her and feels like she can't breathe till the pain subsides. RLQ starting about 3 weeks ago. Feels like she has to urinate. Lomotil one to 3 times per day. Bentyl not effective in past.   PET scan due next week. Having rectal bleeding that varies. Intermittent. No rectal discomfort or pain. Appetite slightly better than what it has been in the past. Diarrhea feels about the same. Eating about 4-5 meals per day, small portions.   The [place] helps with patient's medications. States a written rx for budesonide may be helpful to get covered. Difficulty with coverage in the past.   Patient notes personal history of polyps in remote past.   Past Medical History:    Diagnosis Date   Adenocarcinoma of lung, stage 4 ([place]) 09/2018   Anxiety    Choledochal cyst    Identified on [place] on 05/27/19. Plans to monitor for 6 months. Consider MRI/MRCP in 6 months or if symptomatic.    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 Lissa Morales notes [facility]): Normal resting anal sphincter pressure with normal squeeze and intact anorectal inhibitory reflex.  Mild rectal hypersensitivity.  Findings characteristic of type III dyssynergia.   BLADDER SUSPENSION     COLONOSCOPY W/ BIOPSIES  08/18/2016   [facility]; Surgeon: Dr. Arsenio Loader; No polyps, mucosal inflammation and mucosa appeared normal. S/p random biopsies to r/o collagenous colitis. Pathology consistent with collagenous colitis. Recommended repeat TCS in 10 years.    CORONARY STENT PLACEMENT  2006   ESOPHAGOGASTRODUODENOSCOPY  12/28/2015   [facility]; Surgeon: Dr. Harley [facility]); GAstric subepithelial lesion  about 1.5 cm in distal gastric body along greater curvature of the stomach s/p biopsies. Erythematous mucosal fold in duodenal bulb s/p biopsy. Path: benign stomach biopsies , no H. pylori or malignancy, benign duodenal biopsy.    INGUINAL HERNIA REPAIR     LUNG LOBECTOMY Right 2009   upper lobe   UPPER ESOPHAGEAL ENDOSCOPIC ULTRASOUND (EUS)  01/29/2018   [facility]; [facility]ies [facility]); 1.5 cm subepithelial lesion in  gastric antrum consistent with leiomyoma or GIST. Repeat in 4 years for surveillance.    UPPER ESOPHAGEAL ENDOSCOPIC ULTRASOUND (EUS)  12/28/2015   [facility]; Surgeon: Dr. Harley [facility]); 1.2cm x 1.6 cm gastric lesion. Leiomyoma vs GIST.    UPPER ESOPHAGEAL ENDOSCOPIC ULTRASOUND (EUS)  07/31/2011   Information obtained from Dr. Lissa Morales notes [facility]): 1.5 stomach submucosal lesion in the gastric body   UPPER ESOPHAGEAL ENDOSCOPIC ULTRASOUND (EUS)  01/16/2013   Information obtained from Dr. Lissa Morales notes [facility]): 15 mm x 12 mm stomach submucosal lesion in the stomach body greater curve-no change in size.    Current Outpatient Medications  Medication Sig Dispense Refill   albuterol (VENTOLIN HFA) 108 (90 Base) MCG/ACT inhaler Inhale 2 puffs into the lungs every 6 (six) hours as needed.     aspirin 81 MG EC tablet Take 1 tablet by mouth daily.     atorvastatin (LIPITOR) 40 MG tablet Take 1 tablet by mouth daily.     clonazePAM (KLONOPIN) 0.5 MG tablet Take 0.5 mg by mouth daily as needed for anxiety.     dexlansoprazole (DEXILANT) 60 MG capsule Take 1 capsule (60 mg total) by mouth daily. 90 capsule 3   diphenhydrAMINE (BENADRYL) 25 mg capsule Take 25 mg by mouth as needed.     diphenoxylate-atropine (LOMOTIL) 2.5-0.025 MG tablet Take 1 tablet by mouth every 6 (six) hours as needed.     hydrocortisone (ANUSOL-HC) 2.5 % rectal cream Place 1 application rectally 2 (two) times daily. (Patient taking differently: Place 1 application rectally 2 (two) times daily. As needed) 30 g 1   levothyroxine (SYNTHROID) 25 MCG tablet Take 1 tablet by mouth 2 (two) times daily.      metoprolol tartrate (LOPRESSOR) 50 MG tablet Take 1 tablet by mouth daily.      nitroGLYCERIN (NITROSTAT) 0.4 MG SL tablet Place 0.4 mg under the tongue every 5 (five) minutes as needed for chest pain.     ondansetron (ZOFRAN) 4 MG tablet Take 1 tablet (4 mg total) by mouth every 8 (eight) hours as needed for  nausea or vomiting. 30 tablet 1   promethazine (PHENERGAN) 25 MG tablet Take 1 tablet by mouth every 6 (six) hours as needed.     traZODone (DESYREL) 50 MG tablet Take 1 tablet by mouth at bedtime.     pantoprazole (PROTONIX) 40 MG tablet Take 1 tablet (40 mg total) by mouth 2 (two) times daily before a meal. (Patient not taking: Reported on 03/18/2020) 60 tablet 5   Pembrolizumab (KEYTRUDA IV) Inject into the vein. Currently on hold due to renal and thyroid abnormalities. (Patient not taking: Reported on 03/18/2020)     No current facility-administered medications for this visit.    Allergies as of 03/18/2020 - Review Complete 06/24/2019  Allergen Reaction Noted   Lisinopril Cough 04/22/2019    Family History  Problem Relation Age of Onset   Lung cancer Mother    Congestive Heart Failure Father     Social History   Socioeconomic History   Marital status:  Married    Spouse name: Not on file   Number of children: Not on file   Years of education: Not on file   Highest education level: Not on file  Occupational History   Not on file  Tobacco Use   Smoking status: Current Every Day Smoker    Packs/day: 0.50    Types: Cigarettes   Smokeless tobacco: Never Used  Substance and Sexual Activity   Alcohol use: Not Currently   Drug use: Not Currently   Sexual activity: Not on file  Other Topics Concern   Not on file  Social History Narrative   Not on file   Social Determinants of Health   Financial Resource Strain:    Difficulty of Paying Living Expenses: Not on file  Food Insecurity:    Worried About [place] in the Last Year: Not on file   Ran Out of Food in the Last Year: Not on file  Transportation Needs:    Lack of Transportation (Medical): Not on file   Lack of Transportation (Non-Medical): Not on file  Physical Activity:    Days of Exercise per Week: Not on file   Minutes of Exercise per Session: Not on file  Stress:     Feeling of Stress : Not on file  Social Connections:    Frequency of Communication with Friends and Family: Not on file   Frequency of Social Gatherings with Friends and Family: Not on file   Attends Religious Services: Not on file   Active Member of Clubs or Organizations: Not on file   Attends Archivist Meetings: Not on file   Marital Status: Not on file    Review of Systems: As mentioned in HPI  Physical Exam: BP 95/65    Pulse 66    Temp (!) 97.3 F (36.3 C)    Ht _0  (1.6 m)    Wt 95 lb 3.2 oz (43.2 kg)    BMI 16.86 kg/m  General:   Alert and oriented. No distress noted. Thin, chronically ill-appearing Head:  Normocephalic and atraumatic. Eyes:  Conjuctiva clear without scleral icterus. Mouth:  Mask in place Cardiac: S1 S2 present without murmurs Lungs: clear bilaterally Abdomen:  +BS, soft, non-tender and non-distended. No rebound or guarding. No HSM or masses noted. Msk:  Symmetrical without gross deformities. Normal posture. Extremities:  Without edema. Neurologic:  Alert and  oriented x4 Psych:  Alert and cooperative. Normal mood and affect.  ASSESSMENT: Carolyn Drake is a 69 y.o. 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 Newman Pies (surveillance due 2023). Difficulty in achieving remission of collagenous colitis in light of unable to afford budesonide; currently taking Lomotil.   Now with persistent weight loss, intermittent rectal bleeding. PET scan last year with nonspecific contiguous intense uptake at level of rectum. Updated PET upcoming soon; patient declining rectal exam today. Likely repeat colonoscopy will still yield  findings of collagenous colitis; however, in light of rectal bleeding and weight loss, will move forward with diagnostic colonoscopy.  I have provided a prescription for budesonide again, as she receives assistance from [employer]. I have asked her to let me know as soon as possible if this  is not covered. We may need to pursue compounded formulation from [place] (will be in the range of 50-60$ per month).   PLAN:  Entocort 9 mg daily Avoid NSAIDs Proceed with colonoscopy by Dr. Gala Romney in near future using Propofol: the risks, benefits, and alternatives have been discussed with the patient in detail. The patient states understanding and desires to proceed. Celiac serologies for completeness' sake   Annitta Needs, PhD, ANP-BC [facility]

## 2020-03-19 ENCOUNTER — Telehealth: Payer: Self-pay

## 2020-03-19 NOTE — Telephone Encounter (Signed)
Tried to complete a PA on [email or web address] for Budesonide 3 mg. Covermymeds couldn't identify pts insurance. Lmom, waiting on a return call to see if pt picked her medication up and verify her insurance card that pays for drug coverage.

## 2020-03-23 ENCOUNTER — Telehealth: Payer: Self-pay | Admitting: Internal Medicine

## 2020-03-23 NOTE — Telephone Encounter (Signed)
Pt said her insurance will not cover her prescription on Entocort EC. Please advise. [phone]

## 2020-03-24 NOTE — Telephone Encounter (Signed)
Lmom, waiting on a return call.  

## 2020-03-25 ENCOUNTER — Telehealth: Payer: Self-pay | Admitting: Gastroenterology

## 2020-03-25 NOTE — Telephone Encounter (Signed)
[facility] clinical pool:  I am so sorry, but she should be with Propofol. I am sorry if I didn't put that on the encounter sheet!

## 2020-03-25 NOTE — Telephone Encounter (Signed)
Checked encounter form and it did not have propofol. Called patient to reschedule procedure. She did not want to reschedule at this time. She states she wants to hold off on this. When she is ready to reschedule, she will call back. FYI to [employer]

## 2020-04-01 ENCOUNTER — Telehealth: Payer: Self-pay

## 2020-04-01 NOTE — Telephone Encounter (Signed)
Trying to get pt's Budesonide 3mg . Approved but system is saying pt and information isn't matching up. I see were Carolyn Drake as well is also working on this particular pt.

## 2020-04-07 NOTE — Telephone Encounter (Signed)
Letter mailed to pt.  

## 2020-04-08 ENCOUNTER — Other Ambulatory Visit (HOSPITAL_COMMUNITY): Payer: Medicare Other

## 2020-04-10 ENCOUNTER — Encounter (HOSPITAL_COMMUNITY): Payer: Self-pay

## 2020-04-10 ENCOUNTER — Ambulatory Visit (HOSPITAL_COMMUNITY): Admit: 2020-04-10 | Payer: Medicare Other | Admitting: Internal Medicine

## 2020-04-10 SURGERY — COLONOSCOPY
Anesthesia: Moderate Sedation

## 2020-08-13 ENCOUNTER — Encounter: Payer: Self-pay | Admitting: *Deleted

## 2020-08-13 ENCOUNTER — Other Ambulatory Visit: Payer: Self-pay | Admitting: *Deleted

## 2020-08-13 ENCOUNTER — Encounter: Payer: Self-pay | Admitting: Nurse Practitioner

## 2020-08-13 ENCOUNTER — Other Ambulatory Visit (HOSPITAL_COMMUNITY)
Admission: RE | Admit: 2020-08-13 | Discharge: 2020-08-13 | Disposition: A | Discharge status: Home / Self Care | Payer: Medicare Other | Source: Ambulatory Visit | Attending: Nurse Practitioner | Admitting: Nurse Practitioner

## 2020-08-13 ENCOUNTER — Other Ambulatory Visit: Payer: Self-pay

## 2020-08-13 ENCOUNTER — Ambulatory Visit (INDEPENDENT_AMBULATORY_CARE_PROVIDER_SITE_OTHER): Payer: Medicare Other | Admitting: Nurse Practitioner

## 2020-08-13 ENCOUNTER — Other Ambulatory Visit: Payer: Self-pay | Admitting: Internal Medicine

## 2020-08-13 VITALS — BP 107/60 | HR 69 | Temp 96.9°F | Ht 63.0 in | Wt 97.2 lb

## 2020-08-13 DIAGNOSIS — R197 Diarrhea, unspecified: Secondary | ICD-10-CM | POA: Diagnosis not present

## 2020-08-13 DIAGNOSIS — R109 Unspecified abdominal pain: Secondary | ICD-10-CM | POA: Insufficient documentation

## 2020-08-13 DIAGNOSIS — R110 Nausea: Secondary | ICD-10-CM | POA: Diagnosis not present

## 2020-08-13 DIAGNOSIS — R948 Abnormal results of function studies of other organs and systems: Secondary | ICD-10-CM | POA: Diagnosis present

## 2020-08-13 DIAGNOSIS — K625 Hemorrhage of anus and rectum: Secondary | ICD-10-CM | POA: Insufficient documentation

## 2020-08-13 LAB — CBC WITH DIFFERENTIAL/PLATELET
Abs Immature Granulocytes: 0.02 10*3/uL (ref 0.00–0.07)
Basophils Absolute: 0 10*3/uL (ref 0.0–0.1)
Basophils Relative: 1 %
Eosinophils Absolute: 0.3 10*3/uL (ref 0.0–0.5)
Eosinophils Relative: 4 %
HCT: 27.5 % — ABNORMAL LOW (ref 36.0–46.0)
Hemoglobin: 8 g/dL — ABNORMAL LOW (ref 12.0–15.0)
Immature Granulocytes: 0 %
Lymphocytes Relative: 43 %
Lymphs Abs: 2.7 10*3/uL (ref 0.7–4.0)
MCH: 28.9 pg (ref 26.0–34.0)
MCHC: 29.1 g/dL — ABNORMAL LOW (ref 30.0–36.0)
MCV: 99.3 fL (ref 80.0–100.0)
Monocytes Absolute: 0.4 10*3/uL (ref 0.1–1.0)
Monocytes Relative: 6 %
Neutro Abs: 2.9 10*3/uL (ref 1.7–7.7)
Neutrophils Relative %: 46 %
Platelets: 247 10*3/uL (ref 150–400)
RBC: 2.77 MIL/uL — ABNORMAL LOW (ref 3.87–5.11)
RDW: 18.1 % — ABNORMAL HIGH (ref 11.5–15.5)
WBC: 6.3 10*3/uL (ref 4.0–10.5)
nRBC: 0 % (ref 0.0–0.2)

## 2020-08-13 MED ORDER — PEG 3350-KCL-NA BICARB-NACL 420 G PO SOLR: ORAL | 0 refills | Status: DC

## 2020-08-13 MED ORDER — CLENPIQ 10-3.5-12 MG-GM -GM/160ML PO SOLN: 1.0000 | Freq: Once | ORAL | 0 refills | Status: AC

## 2020-08-13 NOTE — Patient Instructions (Signed)
Your health issues we discussed today were:   Diarrhea: 1. I am sorry you are still having diarrhea 2. Unfortunately, we have tried to get Entocort (budesonide) covered by your insurance now before 3. One "over-the-counter" possible solution is to take Pepto-Bismol 4 times a day, and then Imodium as needed in addition 4. Because of you have any worsening or severe diarrhea  Rectal bleeding with abnormal rectum on PET scan: 1. We will proceed with colonoscopy as soon as possible 2. Have your blood work completed today 3. Further recommendations will follow your colonoscopy 4. As we discussed, if you have any more "large-volume" bleeding or if you have worsening/severe weakness, dizziness, chest pain, passing out, etc. then proceed to the emergency room  Overall I recommend:  1. Continue other current medications 2. Return for follow-up in 4 weeks 3. Call us for any questions or concerns   ---------------------------------------------------------------  I am glad you have gotten your COVID-19 vaccination!  Even though you are fully vaccinated you should continue to follow CDC and state/local guidelines.  ---------------------------------------------------------------   At [facility] we value your feedback. You may receive a survey about your visit today. Please share your experience as we strive to create trusting relationships with our patients to provide genuine, compassionate, quality care.  We appreciate your understanding and patience as we review any laboratory studies, imaging, and other diagnostic tests that are ordered as we care for you. Our office policy is 5 business days for review of these results, and any emergent or urgent results are addressed in a timely manner for your best interest. If you do not hear from our office in 1 week, please contact us.   We also encourage the use of MyChart, which contains your medical information for your review as well.  If you are not enrolled in this feature, an access code is on this after visit summary for your convenience. Thank you for allowing [place] to be involved in your care.  It was great to see you today!  I hope you have a great spring!!

## 2020-08-13 NOTE — Progress Notes (Signed)
Received stat CBC results with significant decline in hemoglobin to 8.0 (was 10.1 a couple months ago).  I have asked the nurse to call the patient and recommend she go to the emergency department for further evaluation.  She may need blood transfusion given her age, symptomatic status.  If she is admitted then colonoscopy could be completed as an inpatient.  Otherwise, recommend to keep her appointment on Tuesday for outpatient colonoscopy.

## 2020-08-13 NOTE — Progress Notes (Signed)
Primary Care Physician:  Kendrick Ranch, MD Primary Gastroenterologist:  Dr. Gala Romney  Chief Complaint  Patient presents with  . Rectal Bleeding    Having a lot of bleeding with clots. Wants to discuss tcs  . Abdominal Pain    cramping  . Diarrhea    HPI:   Carolyn Drake is a 70 y.o. female who presents to schedule colonoscopy.  The patient was last seen in our office 03/18/2020 for rectal bleeding, GERD, abdominal pain, chronic diarrhea.  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 Newman Pies (next surveillance due in 2023).  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  September 2020 PET scan with nonspecific contiguous intense uptake at the level of the rectum although the patient declined rectal exam that day.  [facility] was requesting a diagnostic colonoscopy.  She has had significant weight decline from 121 pounds in December 20 20 to 95 pounds in November 2021.  At her last visit noted 3-4 watery stools a day with abdominal cramping for the past 3 weeks.  Lomotil 1-3 times a day, Bentyl previously not effective.  Upcoming PET scan next week.  At that time also noted variable rectal bleeding that is intermittent without discomfort.  Some improvement in appetite, although diarrhea remains unchanged.  [place] helps with the patient's medications.  She states a written prescription for the desonide it may be helpful all to get covered, has had difficulty with coverage in the past.  Noted previous history of colon polyps.  Recommended prescription for  budesonide (Entocort), update blood work, avoid NSAIDs and smoking, colonoscopy.  It does not appear her blood work (TTG IgA, IgA) were completed.  Unfortunately, insurance would not cover her budesonide (Entocort).  The patient was scheduled for colonoscopy but then canceled and declined to reschedule at that time.  Today she states she is doing okay overall. She started having worsening bleeding about 2-3 weeks ago. Doesn't bleed with every bowel movement, would anticipate 3-5/10 stools with blood. A week ago she had a bowel movement with no stool, just blood and clots. Is having worsening fatigue, dizziness; a little worse then baseline. Not on Kaytruda or other treatments. Still with diarrhea, every stool is loose, watery with "little foreign objects" and "stringiness" in it. Has about 3-4 a day, sometimes has nocturnal stools. No sick contacts. Diarrhea has been ongoing "seems like forever." About the same compared to her baseline. She takes lomotil for her diarrhea, which helps some. Unable to afford Entocort and insurance/community center wouldn't cover it. Has frequent nausea, has Zofran and Phenerrgan which helps most of the time. Denies fever, chills. Continues to lost weight, although appears to be up a few pounds/stable compared to November 2021. Denies URI or flu-like symptoms. Denies loss of sense of taste or smell. The patient has received COVID-19 vaccination(s). Denies chest pain, dyspnea, dizziness, lightheadedness, syncope, near syncope. Denies any other upper or lower GI symptoms.  Denies GERD symptoms.  Past Medical History:  Diagnosis Date  . Adenocarcinoma of lung, stage 4 ([place]) 09/2018  . Anxiety   . Choledochal cyst  Identified on [place] on 05/27/19. Plans to monitor for 6 months. Consider MRI/MRCP in 6 months or if symptomatic.   . Collagenous colitis 2018  . Colon polyp   . COPD (chronic obstructive pulmonary disease) ([place])   . Depression   . 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 GERD (gastroesophageal reflux disease)   . HLD (hyperlipidemia)   . HTN (hypertension)   . Lesion of stomach    followed by routine EUS, last in 2019; surveillance due in 2023  . Lung cancer ([place]) 2008   s/p resenction  . Myocardial infarction ([place])   . Sleep apnea     Past Surgical History:  Procedure Laterality Date  . ABDOMINAL HYSTERECTOMY    . ANORECTAL MANOMETRY  12/07/2015   Information obtained from Dr. Lissa Morales notes [facility]): Normal resting anal sphincter pressure with normal squeeze and intact anorectal inhibitory reflex.  Mild rectal hypersensitivity.  Findings characteristic of type III dyssynergia.  Marland Kitchen BLADDER SUSPENSION    . COLONOSCOPY W/ BIOPSIES  08/18/2016   [facility]; Surgeon: Dr. Arsenio Loader; No polyps, mucosal inflammation and mucosa appeared normal. S/p random biopsies to r/o collagenous colitis. Pathology consistent with collagenous colitis. Recommended repeat TCS in 10 years.   . CORONARY STENT PLACEMENT  2006  . ESOPHAGOGASTRODUODENOSCOPY  12/28/2015   [facility]; Surgeon: Dr. Harley Hallmark [facility]); GAstric subepithelial lesion about 1.5 cm in distal gastric body along greater curvature of the stomach s/p biopsies. Erythematous mucosal fold in duodenal bulb s/p biopsy. Path: benign stomach biopsies , no H. pylori or malignancy, benign duodenal biopsy.   . INGUINAL HERNIA REPAIR    . LUNG LOBECTOMY Right 2009   upper lobe  . UPPER ESOPHAGEAL ENDOSCOPIC ULTRASOUND (EUS)  01/29/2018   [facility]; Surgeon: Dr. Newman Pies [facility]); 1.5 cm subepithelial lesion in gastric antrum consistent with leiomyoma or GIST. Repeat in 4 years for surveillance.   Marland Kitchen UPPER ESOPHAGEAL ENDOSCOPIC ULTRASOUND (EUS)  12/28/2015   [facility]; Surgeon: Dr. Harley Hallmark [facility]); 1.2cm x 1.6 cm gastric lesion. Leiomyoma vs GIST.   Marland Kitchen UPPER ESOPHAGEAL ENDOSCOPIC ULTRASOUND (EUS)  07/31/2011   Information obtained from Dr. Lissa Morales notes  [facility]): 1.5 stomach submucosal lesion in the gastric body  . UPPER ESOPHAGEAL ENDOSCOPIC ULTRASOUND (EUS)  01/16/2013   Information obtained from Dr. Lissa Morales notes [facility]): 15 mm x 12 mm stomach submucosal lesion in the stomach body greater curve-no change in size.    Current Outpatient Medications  Medication Sig Dispense Refill  . albuterol (VENTOLIN HFA) 108 (90 Base) MCG/ACT inhaler Inhale 2 puffs into the lungs every 6 (six) hours as needed.    Marland Kitchen aspirin 81 MG EC tablet Take 1 tablet by mouth daily.    Marland Kitchen atorvastatin (LIPITOR) 40 MG tablet Take 1 tablet by mouth daily.    . clonazePAM (KLONOPIN) 0.5 MG tablet Take 0.5 mg by mouth daily as needed for anxiety.    . diphenhydrAMINE (BENADRYL) 25 mg capsule Take 25 mg by mouth as needed.    . diphenoxylate-atropine (LOMOTIL) 2.5-0.025 MG tablet Take 1 tablet by mouth every 6 (six) hours as needed.    Marland Kitchen escitalopram (LEXAPRO) 20 MG tablet Take 1 tablet by mouth daily.    . Ferrous Fumarate (HEMOCYTE - 106 MG FE) 324 (106 Fe) MG TABS tablet Take 1 tablet by mouth 2 (two) times daily.    . hydrocortisone (ANUSOL-HC) 2.5 % rectal cream Place 1 application rectally 2 (two) times daily. (Patient  taking differently: Place 1 application rectally 2 (two) times daily. As needed) 30 g 1  . levothyroxine (SYNTHROID) 75 MCG tablet Take 75 mcg by mouth daily before breakfast.    . Melatonin 10 MG TABS Take 1 tablet by mouth at bedtime.    . metoprolol tartrate (LOPRESSOR) 50 MG tablet Take 1 tablet by mouth daily.     . nitroGLYCERIN (NITROSTAT) 0.4 MG SL tablet Place 0.4 mg under the tongue every 5 (five) minutes as needed for chest pain.    . NON FORMULARY Magnesium infusion as needed    . pantoprazole (PROTONIX) 40 MG tablet Take 1 tablet by mouth 2 (two) times daily.    . promethazine (PHENERGAN) 25 MG tablet Take 1 tablet by mouth every 6 (six) hours as needed.    . rizatriptan (MAXALT-MLT) 10 MG disintegrating tablet Take 1 tablet by mouth daily as  needed.    . traZODone (DESYREL) 50 MG tablet Take 1 tablet by mouth at bedtime.     No current facility-administered medications for this visit.    Allergies as of 08/13/2020 - Review Complete 08/13/2020  Allergen Reaction Noted  . Lisinopril Cough 04/22/2019    Family History  Problem Relation Age of Onset  . Lung cancer Mother   . Congestive Heart Failure Father   . Colon cancer Neg Hx     Social History   Socioeconomic History  . Marital status: Married    Spouse name: Not on file  . Number of children: Not on file  . Years of education: Not on file  . Highest education level: Not on file  Occupational History  . Not on file  Tobacco Use  . Smoking status: Current Every Day Smoker    Packs/day: 0.50    Types: Cigarettes  . Smokeless tobacco: Never Used  Substance and Sexual Activity  . Alcohol use: Not Currently  . Drug use: Not Currently  . Sexual activity: Not on file  Other Topics Concern  . Not on file  Social History Narrative  . Not on file   Social Determinants of Health   Financial Resource Strain: Not on file  Food Insecurity: Not on file  Transportation Needs: Not on file  Physical Activity: Not on file  Stress: Not on file  Social Connections: Not on file  Intimate Partner Violence: Not on file    Subjective: Review of Systems  Constitutional: Positive for malaise/fatigue. Negative for chills, fever and weight loss.  HENT: Negative for congestion and sore throat.   Respiratory: Negative for cough and shortness of breath.   Cardiovascular: Negative for chest pain and palpitations.  Gastrointestinal: Positive for abdominal pain, blood in stool, diarrhea and nausea. Negative for constipation, heartburn, melena and vomiting.  Musculoskeletal: Negative for joint pain and myalgias.  Skin: Negative for rash.  Neurological: Negative for dizziness and weakness.  Endo/Heme/Allergies: Does not bruise/bleed easily.  Psychiatric/Behavioral: Negative  for depression. The patient is not nervous/anxious.   All other systems reviewed and are negative.      Objective: BP 107/60   Pulse 69   Temp (!) 96.9 F (36.1 C) (Temporal)   Ht _0  (1.6 m)   Wt 97 lb 3.2 oz (44.1 kg)   BMI 17.22 kg/m  Physical Exam Vitals and nursing note reviewed.  Constitutional:      General: She is not in acute distress.    Appearance: Normal appearance. She is well-developed and underweight. She is not ill-appearing, toxic-appearing or diaphoretic.  HENT:  Head: Normocephalic and atraumatic.     Nose: No congestion or rhinorrhea.  Eyes:     General: No scleral icterus. Cardiovascular:     Rate and Rhythm: Normal rate and regular rhythm.     Heart sounds: Normal heart sounds.  Pulmonary:     Effort: Pulmonary effort is normal. No respiratory distress.     Breath sounds: Normal breath sounds.  Abdominal:     General: Bowel sounds are normal.     Palpations: Abdomen is soft. There is no hepatomegaly, splenomegaly or mass.     Tenderness: There is no abdominal tenderness. There is no guarding or rebound.     Hernia: No hernia is present.  Skin:    General: Skin is warm and dry.     Coloration: Skin is not jaundiced.     Findings: No rash.  Neurological:     General: No focal deficit present.     Mental Status: She is alert and oriented to person, place, and time.  Psychiatric:        Attention and Perception: Attention normal.        Mood and Affect: Mood normal.        Speech: Speech normal.        Behavior: Behavior normal.        Thought Content: Thought content normal.        Cognition and Memory: Cognition and memory normal.      Assessment:  Pleasant 70 year old female accompanied by her daughter who presents to schedule colonoscopy.  Noted history of non-small cell lung cancer who was on Keytruda but had to be stopped due to adverse effect of nephritis.  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  PET scan 2020 with nonspecific contiguous intense uptake at the level of the rectum and recommended colonoscopy.  This was scheduled, but the patient had to cancel and declined to reschedule at that time.  Today she presents with worsening bleeding, more regular.  Couple days ago she had large-volume bleeding with blood clots in the commode.  Still having diarrhea with every stool, 3 to 4-day, occasional nocturnal stools.  Previously prescribed Entocort for collagenous colitis but could not get insurance to cover it.  Previous weight loss, although it appears to have stabilized at this point.  Using Lomotil for diarrhea but not very effective.  Overall diarrhea likely multifactorial including at least collagenous colitis which we cannot obtain Entocort.  She could improve with 4 times daily Pepto-Bismol, as needed Imodium.  I am concerned about weight loss and moderate to large volume rectal bleeding.  I am concerned she may have lost some blood as she is having some progressive weakness and fatigue.  I will send her for stat CBC to ensure she has not lost too much blood.  We will plan for an ASAP colonoscopy to further evaluate.  I will bring her back in 4 weeks as well.  Unfortunately, cannot rule out malignant process of the rectum given her symptoms and previous imaging.   Plan: 1. STAT CBC; Celiac panel 2. Colonoscopy as described above 3. Follow-up in 4 weeks    Thank you for allowing [place] to participate in the care of Mellody Life, DNP, AGNP-C Adult & Gerontological Nurse Practitioner [facility]   08/13/2020 9:09 AM   Disclaimer: This note was dictated with  voice recognition software. Similar sounding words can inadvertently be transcribed and may not be corrected upon review.

## 2020-08-13 NOTE — Patient Instructions (Signed)
Carolyn Drake  08/13/2020     @PREFPERIOPPHARMACY @   Your procedure is scheduled on  08/18/2020.   Report to [facility] at  [phone] A.M.   Call this number if you have problems the morning of surgery:  [phone]   Remember:  Follow the diet and prep instructions given to you by the office.                         Take these medicines the morning of surgery with A SIP OF WATER    Clonazepam, lexapro, levothyroxine, metoprolol, protonix, phnergan (if needed), maxalt (if needed).                 Please brush your teeth.  Do not wear jewelry, make-up or nail polish.  Do not wear lotions, powders, or perfumes, or deodorant.  Do not shave 48 hours prior to surgery.  Men may shave face and neck.  Do not bring valuables to the hospital.  [facility] is not responsible for any belongings or valuables.   Contacts, dentures or bridgework may not be worn into surgery.  Leave your suitcase in the car.  After surgery it may be brought to your room.  For patients admitted to the hospital, discharge time will be determined by your treatment team.  Patients discharged the day of surgery will not be allowed to drive home and must have someone with them for 24 hours.    Special instructions:  DO NOT smoke tobacco or vape 24 hours before your procedure.  Please read over the following fact sheets that you were given. Anesthesia Post-op Instructions and Care and Recovery After Surgery       Colonoscopy, Adult, Care After This sheet gives you information about how to care for yourself after your procedure. Your health care provider may also give you more specific instructions. If you have problems or questions, contact your health care provider. What can I expect after the procedure? After the procedure, it is common to have:  A small amount of blood in your stool for 24 hours after the procedure.  Some gas.  Mild cramping or bloating of your abdomen. Follow these  instructions at home: Eating and drinking  Drink enough fluid to keep your urine pale yellow.  Follow instructions from your health care provider about eating or drinking restrictions.  Resume your normal diet as instructed by your health care provider. Avoid heavy or fried foods that are hard to digest.   Activity  Rest as told by your health care provider.  Avoid sitting for a long time without moving. Get up to take short walks every 1-2 hours. This is important to improve blood flow and breathing. Ask for help if you feel weak or unsteady.  Return to your normal activities as told by your health care provider. Ask your health care provider what activities are safe for you. Managing cramping and bloating  Try walking around when you have cramps or feel bloated.  Apply heat to your abdomen as told by your health care provider. Use the heat source that your health care provider recommends, such as a moist heat pack or a heating pad. ? Place a towel between your skin and the heat source. ? Leave the heat on for 20-30 minutes. ? Remove the heat if your skin turns bright red. This is especially important if you are unable to feel pain, heat, or cold. You  may have a greater risk of getting burned.   General instructions  If you were given a sedative during the procedure, it can affect you for several hours. Do not drive or operate machinery until your health care provider says that it is safe.  For the first 24 hours after the procedure: ? Do not sign important documents. ? Do not drink alcohol. ? Do your regular daily activities at a slower pace than normal. ? Eat soft foods that are easy to digest.  Take over-the-counter and prescription medicines only as told by your health care provider.  Keep all follow-up visits as told by your health care provider. This is important. Contact a health care provider if:  You have blood in your stool 2-3 days after the procedure. Get help  right away if you have:  More than a small spotting of blood in your stool.  Large blood clots in your stool.  Swelling of your abdomen.  Nausea or vomiting.  A fever.  Increasing pain in your abdomen that is not relieved with medicine. Summary  After the procedure, it is common to have a small amount of blood in your stool. You may also have mild cramping and bloating of your abdomen.  If you were given a sedative during the procedure, it can affect you for several hours. Do not drive or operate machinery until your health care provider says that it is safe.  Get help right away if you have a lot of blood in your stool, nausea or vomiting, a fever, or increased pain in your abdomen. This information is not intended to replace advice given to you by your health care provider. Make sure you discuss any questions you have with your health care provider. Document Revised: 04/19/2019 Document Reviewed: 11/19/2018 Elsevier Patient Education  2021 [employer] After This sheet gives you information about how to care for yourself after your procedure. Your health care provider may also give you more specific instructions. If you have problems or questions, contact your health care provider. What can I expect after the procedure? After the procedure, it is common to have:  Tiredness.  Forgetfulness about what happened after the procedure.  Impaired judgment for important decisions.  Nausea or vomiting.  Some difficulty with balance. Follow these instructions at home: For the time period you were told by your health care provider:  Rest as needed.  Do not participate in activities where you could fall or become injured.  Do not drive or use machinery.  Do not drink alcohol.  Do not take sleeping pills or medicines that cause drowsiness.  Do not make important decisions or sign legal documents.  Do not take care of children on your own.       Eating and drinking  Follow the diet that is recommended by your health care provider.  Drink enough fluid to keep your urine pale yellow.  If you vomit: ? Drink water, juice, or soup when you can drink without vomiting. ? Make sure you have little or no nausea before eating solid foods. General instructions  Have a responsible adult stay with you for the time you are told. It is important to have someone help care for you until you are awake and alert.  Take over-the-counter and prescription medicines only as told by your health care provider.  If you have sleep apnea, surgery and certain medicines can increase your risk for breathing problems. Follow instructions from your health care  provider about wearing your sleep device: ? Anytime you are sleeping, including during daytime naps. ? While taking prescription pain medicines, sleeping medicines, or medicines that make you drowsy.  Avoid smoking.  Keep all follow-up visits as told by your health care provider. This is important. Contact a health care provider if:  You keep feeling nauseous or you keep vomiting.  You feel light-headed.  You are still sleepy or having trouble with balance after 24 hours.  You develop a rash.  You have a fever.  You have redness or swelling around the IV site. Get help right away if:  You have trouble breathing.  You have new-onset confusion at home. Summary  For several hours after your procedure, you may feel tired. You may also be forgetful and have poor judgment.  Have a responsible adult stay with you for the time you are told. It is important to have someone help care for you until you are awake and alert.  Rest as told. Do not drive or operate machinery. Do not drink alcohol or take sleeping pills.  Get help right away if you have trouble breathing, or if you suddenly become confused. This information is not intended to replace advice given to you by your health care  provider. Make sure you discuss any questions you have with your health care provider. Document Revised: 01/09/2020 Document Reviewed: 03/28/2019 Elsevier Patient Education  2021 [employer].

## 2020-08-14 ENCOUNTER — Other Ambulatory Visit: Payer: Self-pay

## 2020-08-14 ENCOUNTER — Other Ambulatory Visit (HOSPITAL_COMMUNITY)
Admission: RE | Admit: 2020-08-14 | Discharge: 2020-08-14 | Disposition: A | Discharge status: Home / Self Care | Payer: Medicare Other | Source: Ambulatory Visit | Attending: Internal Medicine | Admitting: Internal Medicine

## 2020-08-14 ENCOUNTER — Encounter (HOSPITAL_COMMUNITY): Payer: Self-pay

## 2020-08-14 ENCOUNTER — Encounter (HOSPITAL_COMMUNITY)
Admission: RE | Admit: 2020-08-14 | Discharge: 2020-08-14 | Disposition: A | Discharge status: Home / Self Care | Payer: Medicare Other | Source: Ambulatory Visit | Attending: Internal Medicine | Admitting: Internal Medicine

## 2020-08-14 DIAGNOSIS — Z01818 Encounter for other preprocedural examination: Secondary | ICD-10-CM | POA: Insufficient documentation

## 2020-08-14 DIAGNOSIS — Z20822 Contact with and (suspected) exposure to covid-19: Secondary | ICD-10-CM | POA: Diagnosis not present

## 2020-08-14 DIAGNOSIS — I10 Essential (primary) hypertension: Secondary | ICD-10-CM | POA: Diagnosis not present

## 2020-08-14 LAB — TISSUE TRANSGLUTAMINASE, IGA: Tissue Transglutaminase Ab, IgA: <2 U/mL (ref 0–3)

## 2020-08-14 LAB — BASIC METABOLIC PANEL
Anion gap: 8 (ref 5–15)
BUN: 12 mg/dL (ref 8–23)
CO2: 20 mmol/L — ABNORMAL LOW (ref 22–32)
Calcium: 8.6 mg/dL — ABNORMAL LOW (ref 8.9–10.3)
Chloride: 114 mmol/L — ABNORMAL HIGH (ref 98–111)
Creatinine, Ser: 1.65 mg/dL — ABNORMAL HIGH (ref 0.44–1.00)
GFR, Estimated: 33 mL/min — ABNORMAL LOW (ref >60)
Glucose, Bld: 77 mg/dL (ref 70–99)
Potassium: 4.2 mmol/L (ref 3.5–5.1)
Sodium: 142 mmol/L (ref 135–145)

## 2020-08-14 LAB — GLIADIN ANTIBODIES, SERUM
Deamidated Gliadin Abs, IgA: 3 U (ref 0–19)
Deamidated Gliadin Abs, IgG: 1 U (ref 0–19)

## 2020-08-14 NOTE — Telephone Encounter (Signed)
Please see attached

## 2020-08-15 LAB — SARS CORONAVIRUS 2 (TAT 6-24 HRS): SARS Coronavirus 2: NEGATIVE

## 2020-08-15 LAB — RETICULIN ANTIBODIES, IGA W TITER: Reticulin Ab, IgA: NEGATIVE titer (ref <2.5)

## 2020-08-17 ENCOUNTER — Encounter (HOSPITAL_COMMUNITY): Payer: Self-pay | Admitting: Anesthesiology

## 2020-08-17 NOTE — Telephone Encounter (Signed)
Mindy said she had requested lowest volume prep so Clenpiq was sent in. She was also given Tri-Lyte instructions in case Clenpiq wasn't covered. Tri-Lyte was sent in also.  Tried to call pt to see if she got a prep, no answer, LMOVM to inform her to call us if any further is needed.

## 2020-08-18 ENCOUNTER — Telehealth: Payer: Self-pay | Admitting: *Deleted

## 2020-08-18 ENCOUNTER — Telehealth: Payer: Self-pay | Admitting: Internal Medicine

## 2020-08-18 ENCOUNTER — Encounter (HOSPITAL_COMMUNITY): Admission: RE | Payer: Self-pay | Source: Ambulatory Visit

## 2020-08-18 ENCOUNTER — Ambulatory Visit (HOSPITAL_COMMUNITY): Admission: RE | Admit: 2020-08-18 | Payer: Medicare Other | Source: Ambulatory Visit

## 2020-08-18 SURGERY — COLONOSCOPY WITH PROPOFOL
Anesthesia: Monitor Anesthesia Care

## 2020-08-18 NOTE — Telephone Encounter (Signed)
-----   Message from Wilmer Floor, RN sent at 08/18/2020  6:51 AM EDT ----- Regarding: Patient sick/Daughter called and cancelled her procedure Jerlyn Ly,  Ms Ragle's daughter called to cancel procedure today because her mother  is sick   Thanks,   Rosalyn Gess RN

## 2020-08-18 NOTE — Telephone Encounter (Signed)
Called pt's daughter, she done Miralax prep and it made her sick. Our office had given instructions for Clenpiq and Tri-lyte for her to choose from based on cost. Daughter said they knew the jug would make her sick. Offered to reschedule TCS and give Suprep. Daughter doesn't think she will be able to tolerate any of the preps. She will have pt call office when she is feeling better to let us know what she wants to do.  FYI to Carolyn Drake, see previous phone note. Pt cancelled TCS for today.

## 2020-08-18 NOTE — Telephone Encounter (Signed)
[phone] patient daughter called and said patient did not go this morning for her procedure because the prep made her sick

## 2020-08-18 NOTE — Progress Notes (Signed)
Patient's daughter called to cancel procedure for today because her mother is sick from prep. She will call office to reschedule.

## 2020-08-18 NOTE — Telephone Encounter (Signed)
Noted, no further recommendations at this time. Will make further decisions when patient decides what she would like to do.

## 2020-08-18 NOTE — Telephone Encounter (Signed)
LMOVM to call back to r/s 

## 2020-08-27 ENCOUNTER — Inpatient Hospital Stay (HOSPITAL_COMMUNITY)
Admission: AD | Admit: 2020-08-27 | Discharge: 2020-09-01 | DRG: 378 | Disposition: A | Discharge status: Skilled Nursing Facility | Payer: Medicare Other | Source: Other Acute Inpatient Hospital | Attending: Internal Medicine | Admitting: Internal Medicine

## 2020-08-27 ENCOUNTER — Encounter (HOSPITAL_COMMUNITY): Payer: Self-pay | Admitting: Internal Medicine

## 2020-08-27 ENCOUNTER — Other Ambulatory Visit: Payer: Self-pay

## 2020-08-27 DIAGNOSIS — R636 Underweight: Secondary | ICD-10-CM | POA: Diagnosis present

## 2020-08-27 DIAGNOSIS — R7401 Elevation of levels of liver transaminase levels: Secondary | ICD-10-CM | POA: Diagnosis present

## 2020-08-27 DIAGNOSIS — Z801 Family history of malignant neoplasm of trachea, bronchus and lung: Secondary | ICD-10-CM | POA: Diagnosis not present

## 2020-08-27 DIAGNOSIS — Z79899 Other long term (current) drug therapy: Secondary | ICD-10-CM

## 2020-08-27 DIAGNOSIS — Z955 Presence of coronary angioplasty implant and graft: Secondary | ICD-10-CM

## 2020-08-27 DIAGNOSIS — E039 Hypothyroidism, unspecified: Secondary | ICD-10-CM | POA: Diagnosis present

## 2020-08-27 DIAGNOSIS — Z7901 Long term (current) use of anticoagulants: Secondary | ICD-10-CM

## 2020-08-27 DIAGNOSIS — J449 Chronic obstructive pulmonary disease, unspecified: Secondary | ICD-10-CM | POA: Diagnosis present

## 2020-08-27 DIAGNOSIS — E785 Hyperlipidemia, unspecified: Secondary | ICD-10-CM | POA: Diagnosis present

## 2020-08-27 DIAGNOSIS — K625 Hemorrhage of anus and rectum: Principal | ICD-10-CM | POA: Diagnosis present

## 2020-08-27 DIAGNOSIS — I10 Essential (primary) hypertension: Secondary | ICD-10-CM

## 2020-08-27 DIAGNOSIS — Z681 Body mass index (BMI) 19 or less, adult: Secondary | ICD-10-CM | POA: Diagnosis not present

## 2020-08-27 DIAGNOSIS — N179 Acute kidney failure, unspecified: Secondary | ICD-10-CM | POA: Diagnosis present

## 2020-08-27 DIAGNOSIS — Z8249 Family history of ischemic heart disease and other diseases of the circulatory system: Secondary | ICD-10-CM | POA: Diagnosis not present

## 2020-08-27 DIAGNOSIS — Z7982 Long term (current) use of aspirin: Secondary | ICD-10-CM | POA: Diagnosis not present

## 2020-08-27 DIAGNOSIS — K219 Gastro-esophageal reflux disease without esophagitis: Secondary | ICD-10-CM

## 2020-08-27 DIAGNOSIS — Z888 Allergy status to other drugs, medicaments and biological substances status: Secondary | ICD-10-CM

## 2020-08-27 DIAGNOSIS — G4730 Sleep apnea, unspecified: Secondary | ICD-10-CM | POA: Diagnosis present

## 2020-08-27 DIAGNOSIS — F419 Anxiety disorder, unspecified: Secondary | ICD-10-CM | POA: Diagnosis present

## 2020-08-27 DIAGNOSIS — C3490 Malignant neoplasm of unspecified part of unspecified bronchus or lung: Secondary | ICD-10-CM | POA: Diagnosis present

## 2020-08-27 DIAGNOSIS — Z20822 Contact with and (suspected) exposure to covid-19: Secondary | ICD-10-CM | POA: Diagnosis present

## 2020-08-27 DIAGNOSIS — F32A Depression, unspecified: Secondary | ICD-10-CM | POA: Diagnosis present

## 2020-08-27 DIAGNOSIS — D62 Acute posthemorrhagic anemia: Secondary | ICD-10-CM | POA: Diagnosis present

## 2020-08-27 DIAGNOSIS — Z85831 Personal history of malignant neoplasm of soft tissue: Secondary | ICD-10-CM

## 2020-08-27 DIAGNOSIS — I252 Old myocardial infarction: Secondary | ICD-10-CM | POA: Diagnosis not present

## 2020-08-27 DIAGNOSIS — K922 Gastrointestinal hemorrhage, unspecified: Principal | ICD-10-CM

## 2020-08-27 DIAGNOSIS — E876 Hypokalemia: Secondary | ICD-10-CM | POA: Diagnosis present

## 2020-08-27 DIAGNOSIS — F17210 Nicotine dependence, cigarettes, uncomplicated: Secondary | ICD-10-CM | POA: Diagnosis present

## 2020-08-27 DIAGNOSIS — R509 Fever, unspecified: Secondary | ICD-10-CM | POA: Diagnosis present

## 2020-08-27 DIAGNOSIS — D509 Iron deficiency anemia, unspecified: Secondary | ICD-10-CM | POA: Diagnosis present

## 2020-08-27 DIAGNOSIS — Z79890 Hormone replacement therapy (postmenopausal): Secondary | ICD-10-CM

## 2020-08-27 LAB — CBC
HCT: 22.3 % — ABNORMAL LOW (ref 36.0–46.0)
Hemoglobin: 6.8 g/dL — CL (ref 12.0–15.0)
MCH: 28.7 pg (ref 26.0–34.0)
MCHC: 30.5 g/dL (ref 30.0–36.0)
MCV: 94.1 fL (ref 80.0–100.0)
Platelets: 175 10*3/uL (ref 150–400)
RBC: 2.37 MIL/uL — ABNORMAL LOW (ref 3.87–5.11)
RDW: 17.8 % — ABNORMAL HIGH (ref 11.5–15.5)
WBC: 5.8 10*3/uL (ref 4.0–10.5)
nRBC: 0 % (ref 0.0–0.2)

## 2020-08-27 LAB — RETICULOCYTES
Immature Retic Fract: 23.7 % — ABNORMAL HIGH (ref 2.3–15.9)
RBC.: 2.34 MIL/uL — ABNORMAL LOW (ref 3.87–5.11)
Retic Count, Absolute: 56.4 10*3/uL (ref 19.0–186.0)
Retic Ct Pct: 2.4 % (ref 0.4–3.1)

## 2020-08-27 LAB — COMPREHENSIVE METABOLIC PANEL
ALT: 12 U/L (ref 0–44)
AST: 28 U/L (ref 15–41)
Albumin: 2.7 g/dL — ABNORMAL LOW (ref 3.5–5.0)
Alkaline Phosphatase: 82 U/L (ref 38–126)
Anion gap: 7 (ref 5–15)
BUN: 16 mg/dL (ref 8–23)
CO2: 27 mmol/L (ref 22–32)
Calcium: 8.1 mg/dL — ABNORMAL LOW (ref 8.9–10.3)
Chloride: 105 mmol/L (ref 98–111)
Creatinine, Ser: 1.36 mg/dL — ABNORMAL HIGH (ref 0.44–1.00)
GFR, Estimated: 42 mL/min — ABNORMAL LOW (ref >60)
Glucose, Bld: 102 mg/dL — ABNORMAL HIGH (ref 70–99)
Potassium: 3.9 mmol/L (ref 3.5–5.1)
Sodium: 139 mmol/L (ref 135–145)
Total Bilirubin: 0.6 mg/dL (ref 0.3–1.2)
Total Protein: 5.1 g/dL — ABNORMAL LOW (ref 6.5–8.1)

## 2020-08-27 LAB — MAGNESIUM: Magnesium: 1.8 mg/dL (ref 1.7–2.4)

## 2020-08-27 LAB — HEMOGLOBIN AND HEMATOCRIT, BLOOD
HCT: 22.5 % — ABNORMAL LOW (ref 36.0–46.0)
Hemoglobin: 6.8 g/dL — CL (ref 12.0–15.0)

## 2020-08-27 LAB — PROTIME-INR
INR: 1.4 — ABNORMAL HIGH (ref 0.8–1.2)
Prothrombin Time: 17 seconds — ABNORMAL HIGH (ref 11.4–15.2)

## 2020-08-27 LAB — ABO/RH: ABO/RH(D): O POS

## 2020-08-27 MED ORDER — SODIUM CHLORIDE 0.9% IV SOLUTION: Freq: Once | INTRAVENOUS | Status: AC

## 2020-08-27 MED ORDER — PANTOPRAZOLE SODIUM 40 MG IV SOLR
40.0000 mg | Freq: Two times a day (BID) | INTRAVENOUS | Status: DC
  Administered 2020-08-27 – 2020-08-30 (×7): 40 mg via INTRAVENOUS
  Filled 2020-08-27 (×6): qty 40

## 2020-08-27 MED ORDER — ACETAMINOPHEN 650 MG RE SUPP: 650.0000 mg | Freq: Four times a day (QID) | RECTAL | Status: DC | PRN

## 2020-08-27 MED ORDER — ACETAMINOPHEN 325 MG PO TABS
650.0000 mg | ORAL_TABLET | Freq: Four times a day (QID) | ORAL | Status: DC | PRN
Start: 2020-08-27 — End: 2020-09-01

## 2020-08-27 MED ORDER — ALBUTEROL SULFATE HFA 108 (90 BASE) MCG/ACT IN AERS
2.0000 | INHALATION_SPRAY | Freq: Four times a day (QID) | RESPIRATORY_TRACT | Status: DC | PRN
  Filled 2020-08-27: qty 6.7

## 2020-08-27 MED ORDER — LACTATED RINGERS IV SOLN: INTRAVENOUS | Status: DC

## 2020-08-27 NOTE — Progress Notes (Signed)
Brief note regarding plan, with full H&P to follow:  70 year old female who presents to [facility] long hospital for acute GI bleed by way of transfer from the [facility] after admission to the latter facility for acute right hip fracture in the setting of presenting right hip pain after ground-level mechanical fall at home.  She was admitted to the hospital service at [facility], and subsequently underwent right hip ORIF on 08/25/2020 via orthopedic surgical team.  No reported intraoperative complications.  Postoperatively, she was started on Xarelto for thromboembolic prophylaxis, before subsequently developing evidence of a acute gastrointestinal bleed.  Over the course of her hospitalization at [facility], her initial hemoglobin decreased from 9.4-7.5, and fecal occult blood testing performed on 08/27/2020 was found to be positive.  Xarelto was held at that time, and no additional blood thinning agents have been administered, including no aspirin.  Additionally, the patient was started on IV Protonix.  In the context of [facility] having no gastroenterology providers, the patient was subsequently transferred to [facility] for further evaluation and management of acute gastrointestinal bleed.  Of note, her most recent set of vital signs prior to transfer from [facility] were noted to be as follows: Blood pressure 109/53, heart rate 105, respiratory rate 16, and oxygen saturation 93% on room air.  She was subsequently accepted for admission to the med telemetry floor at [facility].  Upon arrival at [facility], vital signs found to be similar to that at time of transfer from [facility].  Stat CBC was ordered, and reflected hemoglobin of 6.8.  Stat type and screen has been ordered, and plan to transfuse 1 unit PRBC now, with repeat H&H ordered to be checked following completion of this transfusion.  NPO.  SCDs.  Monitor on telemetry.   Babs Bertin, DO Hospitalist

## 2020-08-27 NOTE — Progress Notes (Addendum)
Patient arrived on unit, vitals stable.  PT resting in bed, call bell within reach.  Reached out to flow manager for admitting MD and orders.

## 2020-08-27 NOTE — H&P (Signed)
History and Physical    PLEASE NOTE THAT DRAGON DICTATION SOFTWARE WAS USED IN THE CONSTRUCTION OF THIS NOTE.   Carolyn Drake CBS:[id number] DOB: 1950-10-12 DOA: 08/27/2020  PCP: Kendrick Ranch, MD Patient coming from: home   I have personally briefly reviewed patient's old medical records in [place]  Chief Complaint: Bright red blood per rectum  HPI: Carolyn Drake is a 70 y.o. female with medical history significant for hypertension, hyperlipidemia, GERD, stage IV lung cancer, collagenous colitis diagnosed in 2018, chronic iron deficiency anemia with baseline hemoglobin of 10, acquired hypothyroidism, who is admitted to [facility] for further evaluation and management of subacute lower gastrointestinal bleed by way of transfer from [facility].   The patient has been experiencing intermittent bright red blood per rectum over the course of the last 6 weeks.  In this context, she was seen by [facility] gastroenterology on 08/13/2020 for further evaluation of this rectal bleeding.  She conveys that approximately half of her bowel movements over the course of the last 6 months have been associated with bright red blood per rectum, but denies any associated melena.  Denies any significant recent worsening in the frequency of this hematochezia.  Denies any significant abdominal discomfort.  She reports chronic diarrhea, without significant change in frequency thereof.   Per evaluation by [facility] gastroenterology on 08/13/2020, they recommended pursuit of an outpatient colonoscopy.  The patient was in the process of getting scheduled for this endoscopic evaluation when she experienced a mechanical fall resulting in right hip fracture and hospitalization to the hospital service at [facility] on 08/25/2020.  Subsequently, orthopedic surgery was consulted, and she underwent ORIF of the right hip.No reported intraoperative complications.  Postoperatively, she was started on  Xarelto for thromboembolic prophylaxis, before subsequently developing evidence of a acute gastrointestinal bleed.  Over the course of her hospitalization at [facility], her initial hemoglobin decreased from 9.4-7.5, and fecal occult blood testing performed on 08/27/2020 was found to be positive.  Xarelto was held at that time, and no additional blood thinning agents have been administered, including no aspirin.  Additionally, the patient was started on IV Protonix.  In the context of [facility] having no gastroenterology providers, the patient was subsequently transferred to [facility] for further evaluation and management of her subacute lower gastrointestinal bleed. Of note, her most recent set of vital signs prior to transfer from [facility] were noted to be as follows: Blood pressure 109/53, heart rate 105, respiratory rate 16, and oxygen saturation 93% on room air.  She was subsequently accepted for admission to the med telemetry floor at [facility].   Of note, it appears that the patient's baseline hemoglobin is approximately 10, with such value noted in February 2022.  She underwent colonoscopy in 2018, at which time biopsies diagnosed her with collagenous colitis in April 2018.  She also underwent EGD in August 2017 with associated gastric and duodenal biopsies revealing benign findings.  As an outpatient she is on daily baby aspirin.   She denies any associated chest pain, shortness of breath, dizziness, presyncope, or syncope.  Not associate with any recent subjective fever, chills, rigors, or generalized myalgias.  Aside from recent ground-level mechanical fall that resulted in her right hip fracture, she denies any recent or preceding trauma.    Review of Systems: As per HPI otherwise 10 point review of systems negative.   Past Medical History:  Diagnosis Date  . Adenocarcinoma of lung, stage  4 ([place]) 09/2018  . Anxiety   . Choledochal cyst     Identified on [place] on 05/27/19. Plans to monitor for 6 months. Consider MRI/MRCP in 6 months or if symptomatic.   . Collagenous colitis 2018  . Colon polyp   . COPD (chronic obstructive pulmonary disease) ([place])   . Depression   . 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 GERD (gastroesophageal reflux disease)   . HLD (hyperlipidemia)   . HTN (hypertension)   . Lesion of stomach    followed by routine EUS, last in 2019; surveillance due in 2023  . Lung cancer ([place]) 2008   s/p resenction  . Myocardial infarction ([place])   . Sleep apnea     Past Surgical History:  Procedure Laterality Date  . ABDOMINAL HYSTERECTOMY    . ANORECTAL MANOMETRY  12/07/2015   Information obtained from Dr. Lissa Morales notes [facility]): Normal resting anal sphincter pressure with normal squeeze and intact anorectal inhibitory reflex.  Mild rectal hypersensitivity.  Findings characteristic of type III dyssynergia.  Marland Kitchen BLADDER SUSPENSION    . COLONOSCOPY W/ BIOPSIES  08/18/2016   [facility] Anesthesia; Surgeon: Dr. Arsenio Loader; No polyps, mucosal inflammation and mucosa appeared normal. S/p random biopsies to r/o collagenous colitis. Pathology consistent with collagenous colitis. Recommended repeat TCS in 10 years.   . CORONARY STENT PLACEMENT  2006  . ESOPHAGOGASTRODUODENOSCOPY  12/28/2015   [facility] Anesthesia; Surgeon: Dr. Harley [facility]); GAstric subepithelial lesion about 1.5 cm in distal gastric body along greater curvature of the stomach s/p biopsies. Erythematous mucosal fold in duodenal bulb s/p biopsy. Path: benign stomach biopsies , no H. pylori or malignancy, benign duodenal biopsy.   . INGUINAL HERNIA REPAIR    . LUNG LOBECTOMY Right 2009   upper lobe  . UPPER ESOPHAGEAL ENDOSCOPIC ULTRASOUND (EUS)  01/29/2018   [facility] Anesthesia; Sur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 Repeat in 4 years for surveillance.   Marland Kitchen UPPER ESOPHAGEAL  ENDOSCOPIC ULTRASOUND (EUS)  12/28/2015   [facility] Anesthesia; Surgeon: Dr. Harley [facility]); 1.2cm x 1.6 cm gastric lesion. Leiomyoma vs GIST.   Marland Kitchen UPPER ESOPHAGEAL ENDOSCOPIC ULTRASOUND (EUS)  07/31/2011   Information obtained from Dr. Lissa Morales notes [facility]): 1.5 stomach submucosal lesion in the gastric body  . UPPER ESOPHAGEAL ENDOSCOPIC ULTRASOUND (EUS)  01/16/2013   Information obtained from Dr. Lissa Morales notes [facility]): 15 mm x 12 mm stomach submucosal lesion in the stomach body greater curve-no change in size.    Social History:  reports that she has been smoking cigarettes. She has been smoking about 0.50 packs per day. She has never used smokeless tobacco. She reports previous alcohol use. She reports previous drug use.   Allergies  Allergen Reactions  . Lisinopril Cough    Family History  Problem Relation Age of Onset  . Lung cancer Mother   . Congestive Heart Failure Father   . Colon cancer Neg Hx      Prior to Admission medications   Medication Sig Start Date End Date Taking? Authorizing Provider  albuterol (VENTOLIN HFA) 108 (90 Base) MCG/ACT inhaler Inhale 2 puffs into the lungs every 6 (six) hours as needed for shortness of breath or wheezing. 03/13/19  Yes [provider]  aspirin 81 MG EC tablet Take 81 mg by mouth daily.   Yes [provider]  atorvastatin (LIPITOR) 40 MG tablet Take 40 mg by mouth daily. 04/05/19  Yes  [provider]  clonazePAM (KLONOPIN) 0.5 MG tablet Take 0.5 mg by mouth daily as needed for anxiety.   Yes [provider]  diphenhydrAMINE (BENADRYL) 25 mg capsule Take 25 mg by mouth every 6 (six) hours as needed for allergies.   Yes [provider]  diphenoxylate-atropine (LOMOTIL) 2.5-0.025 MG tablet Take 1 tablet by mouth every 6 (six) hours as needed for diarrhea or loose stools. 03/20/19  Yes [provider]  escitalopram (LEXAPRO) 20 MG tablet Take 20 mg by mouth daily. 08/16/17  Yes [provider]  Ferrous Fumarate (HEMOCYTE - 106 MG FE) 324 (106 Fe) MG TABS tablet Take 1 tablet by mouth 2 (two) times daily. 07/08/20  Yes [provider]  levothyroxine (SYNTHROID) 75 MCG tablet Take 75 mcg by mouth daily before breakfast.   Yes [provider]  Melatonin 10 MG TABS Take 10 mg by mouth at bedtime.   Yes [provider]  metoprolol tartrate (LOPRESSOR) 50 MG tablet Take 50 mg by mouth daily. 04/05/19  Yes [provider]  nitroGLYCERIN (NITROSTAT) 0.4 MG SL tablet Place 0.4 mg under the tongue every 5 (five) minutes as needed for chest pain.   Yes [provider]  pantoprazole (PROTONIX) 40 MG tablet Take 40 mg by mouth 2 (two) times daily. 11/05/19  Yes [provider]  promethazine (PHENERGAN) 25 MG tablet Take 25 mg by mouth every 6 (six) hours as needed for vomiting or nausea. 02/14/18  Yes [provider]  rizatriptan (MAXALT-MLT) 10 MG disintegrating tablet Take 10 mg by mouth daily as needed for migraine. 04/27/20  Yes [provider]  traZODone (DESYREL) 50 MG tablet Take 50 mg by mouth at bedtime. 04/05/19  Yes [provider]  bisacodyl (BISACODYL) 5 MG EC tablet Take 5 mg by mouth daily as needed for moderate constipation or mild constipation.    [provider]  hydrocortisone (ANUSOL-HC) 2.5 % rectal cream Place 1 application rectally 2 (two) times daily. Patient not taking: No sig reported 04/22/19   Erenest Rasher, PA-C  polyethylene glycol (MIRALAX / GLYCOLAX) 17 g packet Take 17 g by mouth daily as needed for mild constipation.    [provider]  polyethylene glycol-electrolytes (NULYTELY) 420 g solution As directed Patient not taking: Reported on 08/27/2020 08/13/20   Eloise Harman, DO     Objective    Physical Exam: Vitals:   08/27/20 1740 08/27/20 1900 08/27/20 2217  BP: (!) 100/56  (!) 95/57  Pulse: 96  96  Resp: 16  15  Temp: 99.5 F (37.5 C)  99.8  F (37.7 C)  TempSrc: Oral  Oral  SpO2: 95%  (!) 88%  Weight:  45 kg   Height:  _0  (1.6 m)     General: appears to be stated age; alert, oriented Skin: warm, dry, no rash Head:  AT/[place] Mouth:  Oral mucosa membranes appear moist, normal dentition Neck: supple; trachea midline Heart:  RRR; did not appreciate any M/R/G Lungs: CTAB, did not appreciate any wheezes, rales, or rhonchi Abdomen: + BS; soft, ND, NT Vascular: 2+ pedal pulses b/l; 2+ radial pulses b/l Extremities: no peripheral edema, no muscle wasting Neuro: sensation intact in upper and lower extremities b/l     Labs on Admission: I have personally reviewed following labs and imaging studies  CBC: Recent Labs  Lab 08/27/20 2034  WBC 5.8  HGB 6.8*  6.8*  HCT 22.5*  22.3*  MCV 94.1  PLT  128   Basic Metabolic Panel: Recent Labs  Lab 08/27/20 2034  NA 139  K 3.9  CL 105  CO2 27  GLUCOSE 102*  BUN 16  CREATININE 1.36*  CALCIUM 8.1*  MG 1.8   GFR: Estimated Creatinine Clearance: 27 mL/min (A) (by C-G formula based on SCr of 1.36 mg/dL (H)). Liver Function Tests: Recent Labs  Lab 08/27/20 2034  AST 28  ALT 12  ALKPHOS 82  BILITOT 0.6  PROT 5.1*  ALBUMIN 2.7*   No results for input(s): LIPASE, AMYLASE in the last 168 hours. No results for input(s): AMMONIA in the last 168 hours. Coagulation Profile: Recent Labs  Lab 08/27/20 2034  INR 1.4*   Cardiac Enzymes: No results for input(s): CKTOTAL, CKMB, CKMBINDEX, TROPONINI in the last 168 hours. BNP (last 3 results) No results for input(s): PROBNP in the last 8760 hours. HbA1C: No results for input(s): HGBA1C in the last 72 hours. CBG: No results for input(s): GLUCAP in the last 168 hours. Lipid Profile: No results for input(s): CHOL, HDL, LDLCALC, TRIG, CHOLHDL, LDLDIRECT in the last 72 hours. Thyroid Function Tests: No results for input(s): TSH, T4TOTAL, FREET4, T3FREE, THYROIDAB in the last 72 hours. Anemia Panel: No results for  input(s): VITAMINB12, FOLATE, FERRITIN, TIBC, IRON, RETICCTPCT in the last 72 hours. Urine analysis: No results found for: COLORURINE, APPEARANCEUR, LABSPEC, PHURINE, GLUCOSEU, HGBUR, BILIRUBINUR, KETONESUR, PROTEINUR, UROBILINOGEN, NITRITE, LEUKOCYTESUR  Radiological Exams on Admission: No results found.    Assessment/Plan   Carolyn Drake is a 70 y.o. female with medical history significant for hypertension, hyperlipidemia, GERD, stage IV lung cancer, collagenous colitis diagnosed in 2018, chronic iron deficiency anemia with baseline hemoglobin of 10, acquired hypothyroidism, who is admitted to [facility] for further evaluation and management of subacute lower gastrointestinal bleed by way of transfer from [facility].    Principal Problem:   Acute lower GI bleeding Active Problems:   GERD (gastroesophageal reflux disease)   Acute on chronic blood loss anemia   HLD (hyperlipidemia)   HTN (hypertension)    #) Subacute lower gastrointestinal bleed: The patient has been experiencing intermittent bright red blood per rectum over the course of the last 6 weeks, following with outpatient gastroenterology over that time, with ensuing recommendation for outpatient colonoscopy, which was in the process of being scheduled with the patient experienced a mechanical fall leading to right hip fracture and ensuing hospitalization at [facility], as further described above.  She has continued to experience intermittent episodes of bright red blood per rectum and absence of any melena, without significant increase in the frequency of such episodes.  This appears to be associated with acute on chronic iron deficiency anemia, with presenting hemoglobin of 6.8 relative to baseline hemoglobin of 10.1 in February 2022, and similar to most recent prior value at [facility] prior to transfer, which was noted to be 7.5.  Given the duration over which the patient has been experiencing bright red blood  per rectum, suspect a very slow versus intermittent lower gastrointestinal bleed.  She appears hemodynamically stable and otherwise asymptomatic.  Given that the patient's hemoglobin is now below 7, will proceed with transfusion of 1 unit PRBC, with repeat hemoglobin to be checked following this transfusion. Consider gastroenterology consult in the morning to determine inpatient versus outpatient arrangements for colonoscopy.   Plan: NPO.  SCDs.  Avoiding pharmacologic anticoagulation for now.  Recommend management of acute on chronic blood loss anemia, including transfusion 1 year.  AC, as further described  below.  Repeat INR in the morning.  I have ordered every 4 hour H&H checks through 9 AM tomorrow morning.  Monitor on telemetry.  Monitor continuous pulse oximetry.      #) Acute on chronic iron deficiency anemia: In the context of documented history of chronic iron deficiency anemia with baseline hemoglobin of 10, as recent as February 2022, on daily oral iron supplementation, the patient's hemoglobin has been gradually trending down since onset of the patient's intermittent bright red blood per rectum approximately 6 weeks ago.  Of note, her initial hemoglobin upon admission to [facility] was found to be 9.4, with final hemoglobin value prior to transfer noted to be 7.5, with today's value upon arriving at [place] [facility] noted to be 6.8.  Appears hemodynamically stable.  Will initiate 1 unit PBC transfusion given the hemoglobin is now less than 7, as above.  Plan: Transfusion 1 unit PRBC every 2 hours x 1 now.  Repeat hemoglobin ordered to be checked 20 to 30 minutes after completion of transfusion of this 1 unit.  Every 4 hour hemoglobin checks been ordered through 9 AM tomorrow.  Repeat INR in the morning.  Monitor on telemetry.  Monitor continuous oximetry.  Add on total iron, TIBC, ferritin, MMA, reticulocyte count, and folic acid level.  Refrain from formal anticoagulation for now.   SCDs.      #) Hypertension: On Lopressor as outpatient.  In the setting of presenting subacute lower gastrointestinal bleed, will hold beta-blocker for now.  Plan: Hold beta-blocker for now, as further detailed above.  Close monitoring of ensuing blood pressure via routine vital signs.     #) Hyperlipidemia: On high intensity atorvastatin as an outpatient.   Plan: In the setting of current n.p.o. status, will hold home statin for now.     #) GERD: On Protonix as an outpatient  Plan: In setting of current n.p.o. status, have ordered IV Protonix for now.     #) Acquired hypothyroidism: On Synthroid supplementation at home.  Plan: We will hold home Synthroid for now in the setting of current n.p.o. status.     DVT prophylaxis: SCDs Code Status: Full code Family Communication: none Disposition Plan: Per Rounding Team Consults called: none  Admission status: Observation; med telemetry     Of note, this patient was added by me to the following Admit List/Treatment Team: [email or web address].      PLEASE NOTE THAT DRAGON DICTATION SOFTWARE WAS USED IN THE CONSTRUCTION OF THIS NOTE.   Rhetta Mura DO [facility] Pager [phone] From [place]   08/27/2020, 10:52 PM

## 2020-08-27 NOTE — Progress Notes (Signed)
Critical Lab Hemoglobin 6.8 Provider has been Notified

## 2020-08-28 ENCOUNTER — Inpatient Hospital Stay (HOSPITAL_COMMUNITY): Payer: Medicare Other

## 2020-08-28 DIAGNOSIS — E785 Hyperlipidemia, unspecified: Secondary | ICD-10-CM | POA: Diagnosis present

## 2020-08-28 DIAGNOSIS — I10 Essential (primary) hypertension: Secondary | ICD-10-CM | POA: Diagnosis present

## 2020-08-28 DIAGNOSIS — D62 Acute posthemorrhagic anemia: Secondary | ICD-10-CM | POA: Diagnosis present

## 2020-08-28 LAB — CBC
HCT: 21.3 % — ABNORMAL LOW (ref 36.0–46.0)
Hemoglobin: 6.4 g/dL — CL (ref 12.0–15.0)
MCH: 28.6 pg (ref 26.0–34.0)
MCHC: 30 g/dL (ref 30.0–36.0)
MCV: 95.1 fL (ref 80.0–100.0)
Platelets: 175 10*3/uL (ref 150–400)
RBC: 2.24 MIL/uL — ABNORMAL LOW (ref 3.87–5.11)
RDW: 17.8 % — ABNORMAL HIGH (ref 11.5–15.5)
WBC: 5.3 10*3/uL (ref 4.0–10.5)
nRBC: 0 % (ref 0.0–0.2)

## 2020-08-28 LAB — BASIC METABOLIC PANEL
Anion gap: 7 (ref 5–15)
BUN: 16 mg/dL (ref 8–23)
CO2: 25 mmol/L (ref 22–32)
Calcium: 8.1 mg/dL — ABNORMAL LOW (ref 8.9–10.3)
Chloride: 108 mmol/L (ref 98–111)
Creatinine, Ser: 1.27 mg/dL — ABNORMAL HIGH (ref 0.44–1.00)
GFR, Estimated: 45 mL/min — ABNORMAL LOW (ref >60)
Glucose, Bld: 94 mg/dL (ref 70–99)
Potassium: 3.7 mmol/L (ref 3.5–5.1)
Sodium: 140 mmol/L (ref 135–145)

## 2020-08-28 LAB — MAGNESIUM: Magnesium: 1.7 mg/dL (ref 1.7–2.4)

## 2020-08-28 LAB — CBC WITH DIFFERENTIAL/PLATELET
Abs Immature Granulocytes: 0.02 10*3/uL (ref 0.00–0.07)
Basophils Absolute: 0 10*3/uL (ref 0.0–0.1)
Basophils Relative: 0 %
Eosinophils Absolute: 0.1 10*3/uL (ref 0.0–0.5)
Eosinophils Relative: 2 %
HCT: 24.7 % — ABNORMAL LOW (ref 36.0–46.0)
Hemoglobin: 7.9 g/dL — ABNORMAL LOW (ref 12.0–15.0)
Immature Granulocytes: 0 %
Lymphocytes Relative: 22 %
Lymphs Abs: 1.3 10*3/uL (ref 0.7–4.0)
MCH: 29.2 pg (ref 26.0–34.0)
MCHC: 32 g/dL (ref 30.0–36.0)
MCV: 91.1 fL (ref 80.0–100.0)
Monocytes Absolute: 0.5 10*3/uL (ref 0.1–1.0)
Monocytes Relative: 9 %
Neutro Abs: 4.1 10*3/uL (ref 1.7–7.7)
Neutrophils Relative %: 67 %
Platelets: 182 10*3/uL (ref 150–400)
RBC: 2.71 MIL/uL — ABNORMAL LOW (ref 3.87–5.11)
RDW: 17.8 % — ABNORMAL HIGH (ref 11.5–15.5)
WBC: 6.1 10*3/uL (ref 4.0–10.5)
nRBC: 0 % (ref 0.0–0.2)

## 2020-08-28 LAB — PREPARE RBC (CROSSMATCH)

## 2020-08-28 LAB — IRON AND TIBC
Iron: 13 ug/dL — ABNORMAL LOW (ref 28–170)
Saturation Ratios: 6 % — ABNORMAL LOW (ref 10.4–31.8)
TIBC: 232 ug/dL — ABNORMAL LOW (ref 250–450)
UIBC: 219 ug/dL

## 2020-08-28 LAB — FOLATE: Folate: 9.9 ng/mL (ref >5.9)

## 2020-08-28 LAB — FERRITIN: Ferritin: 48 ng/mL (ref 11–307)

## 2020-08-28 LAB — HEMOGLOBIN AND HEMATOCRIT, BLOOD
HCT: 21.3 % — ABNORMAL LOW (ref 36.0–46.0)
Hemoglobin: 6.5 g/dL — CL (ref 12.0–15.0)

## 2020-08-28 LAB — TSH: TSH: 0.638 u[IU]/mL (ref 0.350–4.500)

## 2020-08-28 LAB — PROTIME-INR
INR: 1.2 (ref 0.8–1.2)
Prothrombin Time: 15.3 seconds — ABNORMAL HIGH (ref 11.4–15.2)

## 2020-08-28 MED ORDER — LEVOTHYROXINE SODIUM 75 MCG PO TABS
75.0000 ug | ORAL_TABLET | Freq: Every day | ORAL | Status: DC
  Administered 2020-08-29 – 2020-09-01 (×4): 75 ug via ORAL
  Filled 2020-08-28 (×4): qty 1

## 2020-08-28 MED ORDER — ATORVASTATIN CALCIUM 40 MG PO TABS
40.0000 mg | ORAL_TABLET | Freq: Every day | ORAL | Status: DC
  Administered 2020-08-29 – 2020-09-01 (×4): 40 mg via ORAL
  Filled 2020-08-28 (×4): qty 1

## 2020-08-28 MED ORDER — TRAMADOL HCL 50 MG PO TABS
50.0000 mg | ORAL_TABLET | Freq: Once | ORAL | Status: AC
  Administered 2020-08-28: 50 mg via ORAL
  Filled 2020-08-28: qty 1

## 2020-08-28 MED ORDER — TECHNETIUM TC 99M-LABELED RED BLOOD CELLS IV KIT
24.3000 | PACK | Freq: Once | INTRAVENOUS | Status: AC | PRN
  Administered 2020-08-28: 24.3 via INTRAVENOUS

## 2020-08-28 MED ORDER — SODIUM CHLORIDE 0.9% IV SOLUTION: Freq: Once | INTRAVENOUS | Status: AC

## 2020-08-28 NOTE — Consult Note (Signed)
Referring Provider: Dr. Raiford Noble Primary Care Physician:  Kendrick Ranch, MD Primary Gastroenterologist:  Althia Forts (Dr. Gala Romney)  Reason for Consultation: Anemia, heme-positive stool  HPI: Doreatha Offer is a 70 y.o. female with recent hip fracture s/p ORIF 08/25/20, on Xarelto for post-op DVT prophylaxis, HTN, GERD, stage IV lung cancer, collagenous colitis (dx in 2018), GIST, and chronic IDA presenting for heme-positive stool.  Patient reports intermittent hematochezia and clots for approximately 1 year.  She was supposed to have an outpatient colonoscopy but states she cannot tolerate prep and had nausea/vomiting.  She recently had a hip fracture and underwent ORIF and was on Xarelto for postop DVT prophylaxis.  She has had worsening anemia since that time.  She had heme positive stools but no frank bleeding.  She typically has diarrhea related to microscopic colitis.  She has abdominal cramping associated with diarrhea.  Reports chronic GERD.  Denies dysphagia, changes in appetite, or unexplained weight loss (states her weight has been stable recently).  Denies family history of colon cancer or gastrointestinal malignancy.  Takes 81 mg aspirin as an outpatient.  Denies NSAID use.  EGD in 01/2018 revealed gastric lesion.  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   Colonoscopy in 2019 with biopsies positive for microscopic colitis.  Past Medical History:  Diagnosis Date  . Adenocarcinoma of lung, stage 4 ([place]) 09/2018  . Anxiety   . Choledochal cyst    Identified on [place] on 05/27/19. Plans to monitor for 6 months. Consider MRI/MRCP in 6 months or if symptomatic.   . Collagenous colitis 2018  . Colon polyp   . COPD (chronic obstructive pulmonary disease) ([place])   . Depression   . 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 GERD (gastroesophageal reflux disease)   . HLD (hyperlipidemia)   . HTN (hypertension)   . Lesion of stomach     followed by routine EUS, last in 2019; surveillance due in 2023  . Lung cancer ([place]) 2008   s/p resenction  . Myocardial infarction ([place])   . Sleep apnea     Past Surgical History:  Procedure Laterality Date  . ABDOMINAL HYSTERECTOMY    . ANORECTAL MANOMETRY  12/07/2015   Information obtained from Dr. Lissa Morales notes [facility]): Normal resting anal sphincter pressure with normal squeeze and intact anorectal inhibitory reflex.  Mild rectal hypersensitivity.  Findings characteristic of type III dyssynergia.  Marland Kitchen BLADDER SUSPENSION    . COLONOSCOPY W/ BIOPSIES  08/18/2016   [facility]; Surgeon: Dr. Arsenio Loader; No polyps, mucosal inflammation and mucosa appeared normal. S/p random biopsies to r/o collagenous colitis. Pathology consistent with collagenous colitis. Recommended repeat TCS in 10 years.   . CORONARY STENT PLACEMENT  2006  . ESOPHAGOGASTRODUODENOSCOPY  12/28/2015   [facility]; Surgeon: Dr. Harley Hallmark [facility]); GAstric subepithelial lesion about 1.5 cm in distal gastric body along greater curvature of the stomach s/p biopsies. Erythematous mucosal fold in duodenal bulb s/p biopsy. Path: benign stomach biopsies , no H. pylori or malignancy, benign duodenal biopsy.   . INGUINAL HERNIA REPAIR    . LUNG LOBECTOMY Right 2009   upper lobe  . UPPER ESOPHAGEAL ENDOSCOPIC ULTRASOUND (EUS)  01/29/2018   [facility]; [facility] Newman [facility]); 1.5 cm subepithelial lesion in gastric antrum consistent with leiomyoma or GIST. Repeat in 4 years for surveillance.   Marland Kitchen UPPER ESOPHAGEAL ENDOSCOPIC ULTRASOUND (EUS)  12/28/2015   [facility]; Surgeon: Dr. Harley Hallmark [facility]); 1.2cm  x 1.6 cm gastric lesion. Leiomyoma vs GIST.   Marland Kitchen UPPER ESOPHAGEAL ENDOSCOPIC ULTRASOUND (EUS)  07/31/2011   Information obtained from Dr. Lissa Morales notes [facility]): 1.5 stomach submucosal lesion in the gastric body  . UPPER ESOPHAGEAL ENDOSCOPIC ULTRASOUND (EUS)  01/16/2013   Information obtained from Dr. Lissa Morales notes  [facility]): 15 mm x 12 mm stomach submucosal lesion in the stomach body greater curve-no change in size.    Prior to Admission medications   Medication Sig Start Date End Date Taking? Authorizing Provider  albuterol (VENTOLIN HFA) 108 (90 Base) MCG/ACT inhaler Inhale 2 puffs into the lungs every 6 (six) hours as needed for shortness of breath or wheezing. 03/13/19  Yes [provider]  aspirin 81 MG EC tablet Take 81 mg by mouth daily.   Yes [provider]  atorvastatin (LIPITOR) 40 MG tablet Take 40 mg by mouth daily. 04/05/19  Yes [provider]  clonazePAM (KLONOPIN) 0.5 MG tablet Take 0.5 mg by mouth daily as needed for anxiety.   Yes [provider]  diphenhydrAMINE (BENADRYL) 25 mg capsule Take 25 mg by mouth every 6 (six) hours as needed for allergies.   Yes [provider]  diphenoxylate-atropine (LOMOTIL) 2.5-0.025 MG tablet Take 1 tablet by mouth every 6 (six) hours as needed for diarrhea or loose stools. 03/20/19  Yes [provider]  escitalopram (LEXAPRO) 20 MG tablet Take 20 mg by mouth daily. 08/16/17  Yes [provider]  Ferrous Fumarate (HEMOCYTE - 106 MG FE) 324 (106 Fe) MG TABS tablet Take 1 tablet by mouth 2 (two) times daily. 07/08/20  Yes [provider]  levothyroxine (SYNTHROID) 75 MCG tablet Take 75 mcg by mouth daily before breakfast.   Yes [provider]  Melatonin 10 MG TABS Take 10 mg by mouth at bedtime.   Yes [provider]  metoprolol tartrate (LOPRESSOR) 50 MG tablet Take 50 mg by mouth daily. 04/05/19  Yes [provider]  nitroGLYCERIN (NITROSTAT) 0.4 MG SL tablet Place 0.4 mg under the tongue every 5 (five) minutes as needed for chest pain.   Yes [provider]  pantoprazole (PROTONIX) 40 MG tablet Take 40 mg by mouth 2 (two) times daily. 11/05/19  Yes [provider]  promethazine (PHENERGAN) 25 MG tablet Take 25 mg by mouth every 6 (six) hours as  needed for vomiting or nausea. 02/14/18  Yes [provider]  rizatriptan (MAXALT-MLT) 10 MG disintegrating tablet Take 10 mg by mouth daily as needed for migraine. 04/27/20  Yes [provider]  traZODone (DESYREL) 50 MG tablet Take 50 mg by mouth at bedtime. 04/05/19  Yes [provider]  bisacodyl (BISACODYL) 5 MG EC tablet Take 5 mg by mouth daily as needed for moderate constipation or mild constipation.    [provider]  hydrocortisone (ANUSOL-HC) 2.5 % rectal cream Place 1 application rectally 2 (two) times daily. Patient not taking: No sig reported 04/22/19   Erenest Rasher, PA-C  polyethylene glycol (MIRALAX / GLYCOLAX) 17 g packet Take 17 g by mouth daily as needed for mild constipation.    [provider]  polyethylene glycol-electrolytes (NULYTELY) 420 g solution As directed Patient not taking: Reported on 08/27/2020 08/13/20   Eloise Harman, DO    Scheduled Meds: . pantoprazole (PROTONIX) IV  40 mg Intravenous Q12H   Continuous Infusions: . lactated ringers 50 mL/hr at 08/27/20 2231   PRN Meds:.acetaminophen **OR** acetaminophen, albuterol  Allergies as of 08/27/2020 - Review Complete  08/27/2020  Allergen Reaction Noted  . Lisinopril Cough 04/22/2019    Family History  Problem Relation Age of Onset  . Lung cancer Mother   . Congestive Heart Failure Father   . Colon cancer Neg Hx     Social History   Socioeconomic History  . Marital status: Married    Spouse name: Not on file  . Number of children: Not on file  . Years of education: Not on file  . Highest education level: Not on file  Occupational History  . Not on file  Tobacco Use  . Smoking status: Current Every Day Smoker    Packs/day: 0.50    Types: Cigarettes  . Smokeless tobacco: Never Used  Vaping Use  . Vaping Use: Never used  Substance and Sexual Activity  . Alcohol use: Not Currently  . Drug use: Not Currently  . Sexual activity: Not on file   Other Topics Concern  . Not on file  Social History Narrative  . Not on file   Social Determinants of Health   Financial Resource Strain: Not on file  Food Insecurity: Not on file  Transportation Needs: Not on file  Physical Activity: Not on file  Stress: Not on file  Social Connections: Not on file  Intimate Partner Violence: Not on file    Review of Systems: Review of Systems  Constitutional: Negative for chills and fever.  HENT: Negative for hearing loss and tinnitus.   Eyes: Negative for pain and redness.  Respiratory: Positive for shortness of breath. Negative for cough.   Cardiovascular: Negative for chest pain and palpitations.  Gastrointestinal: Positive for abdominal pain, blood in stool and diarrhea. Negative for constipation, heartburn, melena, nausea and vomiting.  Genitourinary: Negative for flank pain and hematuria.  Musculoskeletal: Positive for falls and joint pain.  Skin: Negative for itching and rash.  Neurological: Negative for seizures and loss of consciousness.  Endo/Heme/Allergies: Negative for polydipsia. Does not bruise/bleed easily.  Psychiatric/Behavioral: Negative for substance abuse. The patient is not nervous/anxious.      Physical Exam: Vital signs: Vitals:   08/28/20 1015 08/28/20 1016  BP: (!) 112/56 (!) 112/56  Pulse: 88 87  Resp: 14   Temp: 98.8 F (37.1 C) 98.8 F (37.1 C)  SpO2: 100% 100%   Last BM Date: 08/27/20  Physical Exam Vitals reviewed.  Constitutional:      General: She is not in acute distress.    Interventions: Nasal cannula in place.  HENT:     Head: Normocephalic and atraumatic.     Nose: Nose normal. No congestion.     Mouth/Throat:     Mouth: Mucous membranes are moist.     Pharynx: Oropharynx is clear.  Eyes:     General: No scleral icterus.    Extraocular Movements: Extraocular movements intact.     Comments: Conjunctival pallor  Cardiovascular:     Rate and Rhythm: Normal rate and regular rhythm.   Pulmonary:     Effort: Pulmonary effort is normal. No respiratory distress.  Abdominal:     General: Bowel sounds are normal. There is no distension.     Palpations: Abdomen is soft. There is no mass.     Tenderness: There is no abdominal tenderness. There is no guarding or rebound.     Hernia: No hernia is present.  Musculoskeletal:        General: No swelling or tenderness.     Cervical back: Normal range of motion and neck supple.  Skin:  General: Skin is warm and dry.  Neurological:     General: No focal deficit present.     Mental Status: She is oriented to person, place, and time. She is lethargic.  Psychiatric:        Mood and Affect: Mood normal.        Behavior: Behavior normal.      GI:  Lab Results: Recent Labs    08/27/20 2034 08/28/20 0448 08/28/20 0847  WBC 5.8 5.3  --   HGB 6.8*  6.8* 6.4* 6.5*  HCT 22.5*  22.3* 21.3* 21.3*  PLT 175 175  --    BMET Recent Labs    08/27/20 2034 08/28/20 0448  NA 139 140  K 3.9 3.7  CL 105 108  CO2 27 25  GLUCOSE 102* 94  BUN 16 16  CREATININE 1.36* 1.27*  CALCIUM 8.1* 8.1*   LFT Recent Labs    08/27/20 2034  PROT 5.1*  ALBUMIN 2.7*  AST 28  ALT 12  ALKPHOS 82  BILITOT 0.6   PT/INR Recent Labs    08/27/20 2034 08/28/20 0448  LABPROT 17.0* 15.3*  INR 1.4* 1.2     Studies/Results: No results found.  Impression: Anemia, heme positive stools after recent Xarelto use; history of intermittent rectal bleeding x1 year, history of GIST -Hemoglobin 6.5 -INR 1.2 -BUN 16/ Cr 1.27 -Normal ferritin 48, Iron 13, saturation 6  Recent ORIF, recent Xarelto for DVT prophylaxis  Plan: Recommended proceeding with colonoscopy for further evaluation.  Patient refused colonoscopy because she states she cannot tolerate colonoscopy prep without nausea/vomiting.  Offered different prep options (MiraLAX, moviprep) in conjunction with Zofran.  Patient still refused to proceed.  Recommend bleeding scan.  If  bleeding scan is positive, consult IR for consideration of embolization.  Continue to monitor H&H with transfusion as needed to maintain hemoglobin greater than 7.  Patient will need to follow-up with her outpatient GI specialist (Dr. Gala Romney).  [facility] will sign off. Please contact us if we can be of any further assistance during this hospital stay.     LOS: 1 day   Salley Slaughter  PA-C 08/28/2020, 10:32 AM  Contact #  [phone]

## 2020-08-28 NOTE — Progress Notes (Signed)
PROGRESS NOTE    Nicolemarie Wooley  ION:[id number] DOB: 22-Mar-1951 DOA: 08/27/2020 PCP: Kendrick Ranch, MD   Brief Narrative:  HPI per Dr. Babs Bertin on 08/27/20 Kalyssa Anker is a 70 y.o. female with medical history significant for hypertension, hyperlipidemia, GERD, stage IV lung cancer, collagenous colitis diagnosed in 2018, chronic iron deficiency anemia with baseline hemoglobin of 10, acquired hypothyroidism, who is admitted to [facility] for further evaluation and management of subacute lower gastrointestinal bleed by way of transfer from [facility].   The patient has been experiencing intermittent bright red blood per rectum over the course of the last 6 weeks.  In this context, she was seen by [facility] gastroenterology on 08/13/2020 for further evaluation of this rectal bleeding.  She conveys that approximately half of her bowel movements over the course of the last 6 months have been associated with bright red blood per rectum, but denies any associated melena.  Denies any significant recent worsening in the frequency of this hematochezia.  Denies any significant abdominal discomfort.  She reports chronic diarrhea, without significant change in frequency thereof.   Per evaluation by [facility] gastroenterology on 08/13/2020, they recommended pursuit of an outpatient colonoscopy.  The patient was in the process of getting scheduled for this endoscopic evaluation when she experienced a mechanical fall resulting in right hip fracture and hospitalization to the hospital service at [facility] on 08/25/2020.  Subsequently, orthopedic surgery was consulted, and she underwent ORIF of the right hip.No reported intraoperative complications. Postoperatively, she was started on Xarelto for thromboembolic prophylaxis, before subsequently developing evidence of a acute gastrointestinal bleed. Over the course of her hospitalization at [facility], her initial hemoglobin decreased  from 9.4-7.5, and fecal occult blood testing performed on 08/27/2020 was found to be positive. Xarelto was held at that time, and no additional blood thinning agents have been administered,including no aspirin. Additionally, the patient was started on IV Protonix. In the context of [place] nog[facility]enter[facility] providers,the patient was subsequently transferred to [facility] for further evaluation and management of her subacute lower gastrointestinal bleed. Of note, her most recent set of vital signs prior to transfer from [facility] were noted to be as follows: Blood pressure 109/53, heart rate 105, respiratory rate 16, and oxygen saturation 93% on room air. She was subsequently accepted for admission to the med telemetry floor at [facility].   Of note, it appears that the patient's baseline hemoglobin is approximately 10, with such value noted in February 2022.  She underwent colonoscopy in 2018, at which time biopsies diagnosed her with collagenous colitis in April 2018.  She also underwent EGD in August 2017 with associated gastric and duodenal biopsies revealing benign findings.  As an outpatient she is on daily baby aspirin.   She denies any associated chest pain, shortness of breath, dizziness, presyncope, or syncope.  Not associate with any recent subjective fever, chills, rigors, or generalized myalgias.  Aside from recent ground-level mechanical fall that resulted in her right hip fracture, she denies any recent or preceding trauma.  **Interim History GI was consulted for further evaluation and she is typed and screened and transfused 1 unit PRBCs.  GI recommended colonoscopy but patient has adamantly refused to have been [place] recommended nuclear medicine bleeding scan and if it is positive having IR be consulted for possible and ideation  Assessment & Plan:   Principal Problem:   Acute lower GI bleeding Active Problems:   GERD  (gastroesophageal reflux disease)  Acute on chronic blood loss anemia   HLD (hyperlipidemia)   HTN (hypertension)  Acute/Subacute GI Bleeding with bright red blood per rectum and rectal Bleeding and Anemia Hx of Collagenous Colitis  Hx of GIST Tumor -The patient has been experiencing intermittent bright red blood per rectum over the course of the last 6 weeks, following with outpatient gastroenterology over that time, with ensuing recommendation for outpatient colonoscopy, which was in the process of being scheduled with the patient experienced a mechanical fall leading to right hip fracture and ensuing hospitalization at [facility], -She has continued to experience intermittent episodes of bright red blood per rectum and absence of any melena, without significant increase in the frequency of such episodes.   -This appears to be associated with acute on chronic iron deficiency anemia, with presenting hemoglobin of 6.8 relative to baseline hemoglobin of 10.1 in February 2022, and similar to most recent prior value at [facility] prior to transfer, which was noted to be 7.5.   -Given the duration over which the patient has been experiencing bright red blood per rectum, suspect a very slow versus intermittent lower gastrointestinal bleed.  -She appears hemodynamically stable and otherwise asymptomatic.  -She is FOBT positive [facility] and was recently using Xarelto -Hemoglobin/hematocrit was 6.8/22.3 -> 6.4/21.3 has had a history of intermittent rectal bleeding for the last year or so and repeat was 6.5/21.3 -She is getting 1 unit of pRBC -EMEA panel done and showed an iron level of 13, U IBC of 219, TIBC of 232, saturation ratios of 6%, ferritin level 48, folate of 9.9 -INR was 1.2 -Currently holding her anticoagulation prophylaxis for her surgical intervention and also holding aspirin 81 mg p.o. daily -Patient has declined colonoscopy due to issues with prep and GI as discussed about  getting multiple options of getting colonoscopy prep which is declined -Has now recommending a bleeding scan for evaluation of rectal bleeding and anemia and if it is positive recommending interventional consult for consideration of embolization -If GI bleeding scan is negative they are recommending her follow-up with her primary gastroenterologist as an outpatient for further work-up for chronic anemia and GI bleed -Continue monitor for further signs or symptoms of bleeding  Acute on chronic iron deficiency anemia -She has a documented history of chronic iron deficiency anemia with a baseline hemoglobin being 10 as recent of February 2022 and she remained on a daily oral iron supplement -Globin has been trending down slowly and she has had intermittent bright red blood per rectum for last 6 weeks -Above but she is typed and screened and transfused 1 unit of PRBCs and will continue to transfuse less than 7 -Repeat INR today was -We will continue monitor on telemetry -We will refrain from any pharmacological anticoagulation for now and continue with SCDs -GI is following and have offered the patient a colonoscopy but she has refused  Essential hypertension -She is on metoprolol as an outpatient and in the setting of her subacute GI bleed we will hold antihypertensives for now -Continue monitor blood pressures per protocol -Last blood pressure reading was 127/60  Hyperlipidemia -She is on a high intensity atorvastatin as an outpatient -Statin was held on admission but if she tolerates a diet will resume; diet advancement per GI -She takes atorvastatin 40 mg p.o. daily and will resume once able  GERD -Takes pantoprazole as an outpatient -Currently switched to IV PPI twice daily  Acquired Hypothyroidism -Resume Levothyroxine 75 mcg po Daily   AKI -In the  setting of GI bleeding -Patient's BUNs/creatinine went from 12/1.65 -> 16/1.36 -> 16/1.27 -Received 1 unit PRBCs and is still still  getting lactated Ringer's at 50 MLS per hour -Avoid Nephrotoxic medications, contrast dyes, hypotension renally dose medications -Repeat CMP in a.m.  Stage IV Lung Cancer  COPD -Wearing 2 Liters of O2 -Has Supplemental O2 and wears it when needed -SpO2: 100 % O2 Flow Rate (L/min): 2 L/min -Currently not wheezing or in Exacerbation  Right Hip Fx -Recent ORIF at OSH -Was started on Xarelto for thromboembolic prophylaxis but now held due to GI Bleeding  -Will need PT/OT to evaluate and Treat when safe from a GI standpoint   Underweight -Nutritionist consulted and will make diet recommendations once diet has been advanced and medically feasible  DVT prophylaxis: SCDs Code Status: FULL CODE Family Communication: No family present at bedside  Disposition Plan: Pending GI clearance and evaluation by PT and OT  Status is: Inpatient  Remains inpatient appropriate because:Unsafe d/c plan, IV treatments appropriate due to intensity of illness or inability to take PO and Inpatient level of care appropriate due to severity of illness   Dispo: The patient is from: Home              Anticipated d/c is to: TBD              Patient currently is not medically stable to d/c.   Difficult to place patient No  Consultants:   Gastroenterology   Procedures: NM Bleeding SCan   Antimicrobials:  Anti-infectives (From admission, onward)   None        Subjective: Seen and examined at bedside and states that her right hip was hurting today.  Also complaining about some pain where her blood was going and she ended up having a hematoma as her IV blew.  No chest pain, shortness of breath.  States that she has been having on and off intermittent bleeding and refusing a colonoscopy given that the prep makes her nauseous and vomit.  No other concerns or complaints at this time.  Objective: Vitals:   08/28/20 1015 08/28/20 1016 08/28/20 1256 08/28/20 1257  BP: (!) 112/56 (!) 112/56 126/67 126/67   Pulse: 88 87 80   Resp: 14  14   Temp: 98.8 F (37.1 C) 98.8 F (37.1 C) 99.7 F (37.6 C)   TempSrc: Oral Oral Oral   SpO2: 100% 100% 100%   Weight:      Height:        Intake/Output Summary (Last 24 hours) at 08/28/2020 1502 Last data filed at 08/28/2020 1257 Gross per 24 hour  Intake 1115 ml  Output --  Net 1115 ml   Filed Weights   08/27/20 1900  Weight: 45 kg   Examination: Physical Exam:  Constitutional: Thin chronically ill appearing elderly Caucasian female in mild distress Eyes: Lids and conjunctivae normal, sclerae anicteric  ENMT: External Ears, Nose appear normal. Grossly normal hearing. Neck: Appears normal, supple, no cervical masses, normal ROM, no appreciable thyromegaly no JVD Respiratory: Diminished to auscultation bilaterally, no wheezing, rales, rhonchi or crackles. Normal respiratory effort and patient is not tachypenic. No accessory muscle use.  Wearing supplemental oxygen via nasal cannula at 2 L Cardiovascular: RRR, no murmurs / rubs / gallops. S1 and S2 auscultated.  No appreciable lower extremity edema Abdomen: Soft, non-tender, non-distended. Bowel sounds positive.  GU: Deferred. Musculoskeletal: No clubbing / cyanosis of digits/nails.  Has right hip pain on palpation as well as a left  arm IV infiltration and some ecchymosis and a raised lesion Skin: Has an area of infiltration in her left IV site.  No visual rashes or lesions on limited skin evaluation. No induration; Warm and dry.  Neurologic: CN 2-12 grossly intact with no focal deficits. Romberg sign and cerebellar reflexes not assessed.  Psychiatric: Normal judgment and insight. Alert and oriented x 3. Normal mood and appropriate affect.   Data Reviewed: I have personally reviewed following labs and imaging studies  CBC: Recent Labs  Lab 08/27/20 2034 08/28/20 0448 08/28/20 0847  WBC 5.8 5.3  --   HGB 6.8*  6.8* 6.4* 6.5*  HCT 22.5*  22.3* 21.3* 21.3*  MCV 94.1 95.1  --   PLT 175 175   --    Basic Metabolic Panel: Recent Labs  Lab 08/27/20 2034 08/28/20 0448  NA 139 140  K 3.9 3.7  CL 105 108  CO2 27 25  GLUCOSE 102* 94  BUN 16 16  CREATININE 1.36* 1.27*  CALCIUM 8.1* 8.1*  MG 1.8 1.7   GFR: Estimated Creatinine Clearance: 28.9 mL/min (A) (by C-G formula based on SCr of 1.27 mg/dL (H)). Liver Function Tests: Recent Labs  Lab 08/27/20 2034  AST 28  ALT 12  ALKPHOS 82  BILITOT 0.6  PROT 5.1*  ALBUMIN 2.7*   No results for input(s): LIPASE, AMYLASE in the last 168 hours. No results for input(s): AMMONIA in the last 168 hours. Coagulation Profile: Recent Labs  Lab 08/27/20 2034 08/28/20 0448  INR 1.4* 1.2   Cardiac Enzymes: No results for input(s): CKTOTAL, CKMB, CKMBINDEX, TROPONINI in the last 168 hours. BNP (last 3 results) No results for input(s): PROBNP in the last 8760 hours. HbA1C: No results for input(s): HGBA1C in the last 72 hours. CBG: No results for input(s): GLUCAP in the last 168 hours. Lipid Profile: No results for input(s): CHOL, HDL, LDLCALC, TRIG, CHOLHDL, LDLDIRECT in the last 72 hours. Thyroid Function Tests: Recent Labs    08/28/20 0448  TSH 0.638   Anemia Panel: Recent Labs    08/27/20 2034 08/28/20 0448  FOLATE  --  9.9  FERRITIN  --  48  TIBC  --  232*  IRON  --  13*  RETICCTPCT 2.4  --    Sepsis Labs: No results for input(s): PROCALCITON, LATICACIDVEN in the last 168 hours.  No results found for this or any previous visit (from the past 240 hour(s)).   RN Pressure Injury Documentation:     Estimated body mass index is 17.57 kg/m as calculated from the following:   Height as of this encounter: 5\' 3"  (1.6 m).   Weight as of this encounter: 45 kg.  Malnutrition Type:   Malnutrition Characteristics:   Nutrition Interventions:    Radiology Studies: No results found.  Scheduled Meds: . pantoprazole (PROTONIX) IV  40 mg Intravenous Q12H   Continuous Infusions: . lactated ringers 50 mL/hr at  08/27/20 2231    LOS: 1 day   Kerney Elbe, DO [facility] PAGER is on [place]  If 7PM-7AM, please contact night-coverage [email or web address]

## 2020-08-28 NOTE — Progress Notes (Signed)
Initial Nutrition Assessment  DOCUMENTATION CODES:   Underweight  INTERVENTION:  - diet advancement as medically feasible. - will complete NFPE at follow-up.    NUTRITION DIAGNOSIS:   Inadequate oral intake related to inability to eat as evidenced by NPO status.  GOAL:   Patient will meet greater than or equal to 90% of their needs  MONITOR:   Diet advancement,Labs,Weight trends  REASON FOR ASSESSMENT:   Malnutrition Screening Tool  ASSESSMENT:   70 y.o. female with medical history of HTN, HLD, GERD, stage IV lung cancer, collagenous colitis diagnosed in 2018, chronic iron deficiency anemia, and hypothyroidism. She was admitted for evaluation and management of LGIB; transfer from [facility]. She has been experiencing intermittent BRBPR x6 months.  She has been NPO since admission. She is noted to be out of the room to Nuclear Med. Able to talk with RN who reports patient went around 1300 and that testing takes ~3 hours with anticipated return time of ~1600.   Patient has not been seen by a [place] RD at any time in the past.   Weight yesterday was 99 lb, weight on 08/13/20 was 97 lb, and weight on 03/18/20 was 95 lb.   Labs reviewed; creatinine: 1.27 mg/dl, Ca: 8.1 mg/dl, GFR: 45 ml/min. Medications reviewed; 40 mg IV protonix BID. IVF; LR @ 50 ml/hr.     NUTRITION - FOCUSED PHYSICAL EXAM:  unable to complete at this time.   Diet Order:   Diet Order            Diet NPO time specified  Diet effective now                 EDUCATION NEEDS:   No education needs have been identified at this time  Skin:  Skin Assessment: Reviewed RN Assessment  Last BM:  4/21  Height:   Ht Readings from Last 1 Encounters:  08/27/20 5\' 3"  (1.6 m)    Weight:   Wt Readings from Last 1 Encounters:  08/27/20 45 kg    Estimated Nutritional Needs:  Kcal:  1600-1800 kcal Protein:  75-90 grams Fluid:  >/= 1.7 L/day      Jarome Matin, MS, RD, LDN,  CNSC Inpatient Clinical Dietitian RD pager # available in AMION  After hours/weekend pager # available in [facility]

## 2020-08-29 DIAGNOSIS — R509 Fever, unspecified: Secondary | ICD-10-CM

## 2020-08-29 LAB — COMPREHENSIVE METABOLIC PANEL
ALT: 13 U/L (ref 0–44)
AST: 31 U/L (ref 15–41)
Albumin: 2.3 g/dL — ABNORMAL LOW (ref 3.5–5.0)
Alkaline Phosphatase: 76 U/L (ref 38–126)
Anion gap: 11 (ref 5–15)
BUN: 16 mg/dL (ref 8–23)
CO2: 22 mmol/L (ref 22–32)
Calcium: 8.2 mg/dL — ABNORMAL LOW (ref 8.9–10.3)
Chloride: 104 mmol/L (ref 98–111)
Creatinine, Ser: 1.08 mg/dL — ABNORMAL HIGH (ref 0.44–1.00)
GFR, Estimated: 55 mL/min — ABNORMAL LOW (ref >60)
Glucose, Bld: 84 mg/dL (ref 70–99)
Potassium: 3.9 mmol/L (ref 3.5–5.1)
Sodium: 137 mmol/L (ref 135–145)
Total Bilirubin: 0.8 mg/dL (ref 0.3–1.2)
Total Protein: 4.9 g/dL — ABNORMAL LOW (ref 6.5–8.1)

## 2020-08-29 LAB — CBC WITH DIFFERENTIAL/PLATELET
Abs Immature Granulocytes: 0.01 10*3/uL (ref 0.00–0.07)
Abs Immature Granulocytes: 0.02 10*3/uL (ref 0.00–0.07)
Basophils Absolute: 0 10*3/uL (ref 0.0–0.1)
Basophils Absolute: 0 10*3/uL (ref 0.0–0.1)
Basophils Relative: 0 %
Basophils Relative: 0 %
Eosinophils Absolute: 0.1 10*3/uL (ref 0.0–0.5)
Eosinophils Absolute: 0.2 10*3/uL (ref 0.0–0.5)
Eosinophils Relative: 2 %
Eosinophils Relative: 3 %
HCT: 23.1 % — ABNORMAL LOW (ref 36.0–46.0)
HCT: 28.1 % — ABNORMAL LOW (ref 36.0–46.0)
Hemoglobin: 7.3 g/dL — ABNORMAL LOW (ref 12.0–15.0)
Hemoglobin: 8.9 g/dL — ABNORMAL LOW (ref 12.0–15.0)
Immature Granulocytes: 0 %
Immature Granulocytes: 0 %
Lymphocytes Relative: 29 %
Lymphocytes Relative: 25 %
Lymphs Abs: 1.7 10*3/uL (ref 0.7–4.0)
Lymphs Abs: 1.4 10*3/uL (ref 0.7–4.0)
MCH: 28.5 pg (ref 26.0–34.0)
MCH: 28.5 pg (ref 26.0–34.0)
MCHC: 31.6 g/dL (ref 30.0–36.0)
MCHC: 31.7 g/dL (ref 30.0–36.0)
MCV: 90.2 fL (ref 80.0–100.0)
MCV: 90.1 fL (ref 80.0–100.0)
Monocytes Absolute: 0.6 10*3/uL (ref 0.1–1.0)
Monocytes Absolute: 0.5 10*3/uL (ref 0.1–1.0)
Monocytes Relative: 10 %
Monocytes Relative: 10 %
Neutro Abs: 3.4 10*3/uL (ref 1.7–7.7)
Neutro Abs: 3.4 10*3/uL (ref 1.7–7.7)
Neutrophils Relative %: 59 %
Neutrophils Relative %: 62 %
Platelets: 182 10*3/uL (ref 150–400)
Platelets: 190 10*3/uL (ref 150–400)
RBC: 2.56 MIL/uL — ABNORMAL LOW (ref 3.87–5.11)
RBC: 3.12 MIL/uL — ABNORMAL LOW (ref 3.87–5.11)
RDW: 17.4 % — ABNORMAL HIGH (ref 11.5–15.5)
RDW: 17.6 % — ABNORMAL HIGH (ref 11.5–15.5)
WBC: 5.8 10*3/uL (ref 4.0–10.5)
WBC: 5.5 10*3/uL (ref 4.0–10.5)
nRBC: 0 % (ref 0.0–0.2)
nRBC: 0 % (ref 0.0–0.2)

## 2020-08-29 LAB — MAGNESIUM: Magnesium: 1.7 mg/dL (ref 1.7–2.4)

## 2020-08-29 LAB — PHOSPHORUS: Phosphorus: 3 mg/dL (ref 2.5–4.6)

## 2020-08-29 MED ORDER — ACETAMINOPHEN 325 MG PO TABS
650.0000 mg | ORAL_TABLET | Freq: Once | ORAL | Status: AC
  Administered 2020-08-29: 650 mg via ORAL
  Filled 2020-08-29: qty 2

## 2020-08-29 MED ORDER — SODIUM CHLORIDE 0.9% IV SOLUTION: Freq: Once | INTRAVENOUS | Status: DC

## 2020-08-29 MED ORDER — TRAMADOL HCL 50 MG PO TABS
50.0000 mg | ORAL_TABLET | Freq: Four times a day (QID) | ORAL | Status: DC | PRN
  Administered 2020-08-29 – 2020-09-01 (×7): 50 mg via ORAL
  Filled 2020-08-29 (×7): qty 1

## 2020-08-29 MED ORDER — OXYCODONE HCL 5 MG PO TABS
5.0000 mg | ORAL_TABLET | Freq: Four times a day (QID) | ORAL | Status: DC | PRN
  Administered 2020-08-29 – 2020-09-01 (×7): 5 mg via ORAL
  Filled 2020-08-29 (×8): qty 1

## 2020-08-29 NOTE — Progress Notes (Signed)
MD made aware Pt with temperature of 100.8 orally and no other significant acute changes noted in assessment. New orders to be placed. Blood Transfusion to be given later after PO Tylenol and blood cultures drawn.

## 2020-08-29 NOTE — Progress Notes (Signed)
PROGRESS NOTE    Carolyn Drake  ZOX:[id number] DOB: [birth year] DOA: 08/27/2020 PCP: Kendrick Ranch, MD   Brief Narrative:  HPI per Dr. Babs Bertin on 08/27/20 Carolyn Drake is a 70 y.o. female with medical history significant for hypertension, hyperlipidemia, GERD, stage IV lung cancer, collagenous colitis diagnosed in 2018, chronic iron deficiency anemia with baseline hemoglobin of 10, acquired hypothyroidism, who is admitted to [facility] for further evaluation and management of subacute lower gastrointestinal bleed by way of transfer from [facility].   The patient has been experiencing intermittent bright red blood per rectum over the course of the last 6 weeks.  In this context, she was seen by [facility] gastroenterology on 08/13/2020 for further evaluation of this rectal bleeding.  She conveys that approximately half of her bowel movements over the course of the last 6 months have been associated with bright red blood per rectum, but denies any associated melena.  Denies any significant recent worsening in the frequency of this hematochezia.  Denies any significant abdominal discomfort.  She reports chronic diarrhea, without significant change in frequency thereof.   Per evaluation by [facility] gastroenterology on 08/13/2020, they recommended pursuit of an outpatient colonoscopy.  The patient was in the process of getting scheduled for this endoscopic evaluation when she experienced a mechanical fall resulting in right hip fracture and hospitalization to the hospital service at [facility] on 08/25/2020.  Subsequently, orthopedic surgery was consulted, and she underwent ORIF of the right hip.No reported intraoperative complications. Postoperatively, she was started on Xarelto for thromboembolic prophylaxis, before subsequently developing evidence of a acute gastrointestinal bleed. Over the course of her hospitalization at [facility], her initial hemoglobin decreased  from 9.4-7.5, and fecal occult blood testing performed on 08/27/2020 was found to be positive. Xarelto was held at that time, and no additional blood thinning agents have been administered,including no aspirin. Additionally, the patient was started on IV Protonix. In the context of [place] nogastroenterology providers,the patient was subsequently transferred to [facility] for further evaluation and management of her subacute lower gastrointestinal bleed. Of note, her most recent set of vital signs prior to transfer from [facility] were noted to be as follows: Blood pressure 109/53, heart rate 105, respiratory rate 16, and oxygen saturation 93% on room air. She was subsequently accepted for admission to the med telemetry floor at [facility].   Of note, it appears that the patient's baseline hemoglobin is approximately 10, with such value noted in February 2022.  She underwent colonoscopy in 2018, at which time biopsies diagnosed her with collagenous colitis in April 2018.  She also underwent EGD in August 2017 with associated gastric and duodenal biopsies revealing benign findings.  As an outpatient she is on daily baby aspirin.   She denies any associated chest pain, shortness of breath, dizziness, presyncope, or syncope.  Not associate with any recent subjective fever, chills, rigors, or generalized myalgias.  Aside from recent ground-level mechanical fall that resulted in her right hip fracture, she denies any recent or preceding trauma.  **Interim History GI was consulted for further evaluation and she is typed and screened and transfused 1 unit PRBCs.  GI recommended colonoscopy but patient has adamantly refused to have prep so now GI recommended nuclear medicine bleeding scan and if it is positive having IR be consulted for possible embolization.  Nuclear medicine bleeding scan was negative for any acute bleeding so she was advanced on her diet.   Today her  hemoglobin dropped little bit more and was 7.2 this morning so she is typed and screened and transfused 1 unit PRBCs.  Prior to her getting a blood she spiked a temperature of 100.8 so blood cultures x2 are ordered.  We will need to monitor and will need PT OT to further evaluate and treat.  Assessment & Plan:   Principal Problem:   Acute lower GI bleeding Active Problems:   GERD (gastroesophageal reflux disease)   Acute on chronic blood loss anemia   HLD (hyperlipidemia)   HTN (hypertension)  Acute/Subacute GI Bleeding with bright red blood per rectum and rectal Bleeding and Anemia Hx of Collagenous Colitis  Hx of GIST Tumor -The patient has been experiencing intermittent bright red blood per rectum over the course of the last 6 weeks, following with outpatient gastroenterology over that time, with ensuing recommendation for outpatient colonoscopy, which was in the process of being scheduled with the patient experienced a mechanical fall leading to right hip fracture and ensuing hospitalization at [facility], -She has continued to experience intermittent episodes of bright red blood per rectum and absence of any melena, without significant increase in the frequency of such episodes.   -This appears to be associated with acute on chronic iron deficiency anemia, with presenting hemoglobin of 6.8 relative to baseline hemoglobin of 10.1 in February 2022, and similar to most recent prior value at [facility] prior to transfer, which was noted to be 7.5.   -Given the duration over which the patient has been experiencing bright red blood per rectum, suspect a very slow versus intermittent lower gastrointestinal bleed.  -She appears hemodynamically stable and otherwise asymptomatic.  -She is FOBT positive [facility] and was recently using Xarelto -Hemoglobin/hematocrit was 6.8/22.3 -> 6.4/21.3 has had a history of intermittent rectal bleeding for the last year or so and repeat was  6.5/21.3 -She is getting 1 unit of pRBC and after 1 unit her hemoglobin/macular went to 7.9/24.7 then repeat this morning was 7.3/23.1 so we will type and screen and transfuse another 1 unit PRBCs -Anemia panel done and showed an iron level of 13, U IBC of 219, TIBC of 232, saturation ratios of 6%, ferritin level 48, folate of 9.9 -INR was 1.2 -Currently holding her anticoagulation prophylaxis for her surgical intervention and also holding aspirin 81 mg p.o. daily -Patient has declined colonoscopy due to issues with prep and GI as discussed about getting multiple options of getting colonoscopy prep which is declined -GI Has now recommending a bleeding scan for evaluation of rectal bleeding and anemia and if it is positive recommending interventional consult for consideration of embolization but this was NEGATIVE -Continue to monitor and trend hemoglobin and hematocrit -If GI bleeding scan is negative they are recommending her follow-up with her primary gastroenterologist as an outpatient for further work-up for chronic anemia and GI bleed -Continue monitor for further signs or symptoms of bleeding  Acute on chronic iron deficiency anemia -She has a documented history of chronic iron deficiency anemia with a baseline hemoglobin being 10 as recent of February 2022 and she remained on a daily oral iron supplement -Globin has been trending down slowly and she has had intermittent bright red blood per rectum for last 6 weeks -Above but she is typed and screened and transfused 1 unit of PRBCs and will continue to transfuse less than 7 -Repeat INR yesterday was 1.2 -We will continue monitor on telemetry -We will refrain from any pharmacological anticoagulation for now and continue with  SCDs -GI is following and have offered the patient a colonoscopy but she has refused  Essential hypertension -She is on metoprolol as an outpatient and in the setting of her subacute GI bleed we will hold  antihypertensives for now -Continue monitor blood pressures per protocol -Last blood pressure reading was 127/60  Hyperlipidemia -She is on a high intensity atorvastatin as an outpatient -Statin was held on admission but if she tolerates a diet will resume; diet advancement per GI -She takes atorvastatin 40 mg p.o. daily and will resume once able  GERD -Takes pantoprazole as an outpatient -Currently switched to IV PPI twice daily  Acquired Hypothyroidism -Resume Levothyroxine 75 mcg po Daily   AKI -In the setting of GI bleeding -Patient's BUNs/creatinine went from 12/1.65 -> 16/1.36 -> 16/1.27 and today it is further improved to 16/1.08 -Received 1 unit PRBCs and is still still getting lactated Ringer's at 50 MLS per hour -Avoid Nephrotoxic medications, contrast dyes, hypotension renally dose medications -Repeat CMP in a.m.  Stage IV Lung Cancer  COPD -Wearing 2 Liters of O2 -Has Supplemental O2 and wears it when needed -SpO2: 98 % O2 Flow Rate (L/min): 2 L/min -Currently not wheezing or in Exacerbation  Right Hip Fx -Recent ORIF at OSH -Was started on Xarelto for thromboembolic prophylaxis but now held due to GI Bleeding  -Will need PT/OT to evaluate and Treat when safe from a GI standpoint  -Continues to have hip pain and will add pain regimen with p.o. tramadol and p.o. oxycodone in addition to her acetaminophen  Fever -Unclear etiology -Spiked a temperature of 100.8 -Obtain blood cultures x2 -Continue to monitor for signs and symptoms of infection; currently no overt infection noted -Repeat CBC in the a.m. and continue monitor and trend temperature curve  Underweight -Nutritionist consulted and will make diet recommendations once diet has been advanced and medically feasible  DVT prophylaxis: SCDs Code Status: FULL CODE Family Communication: Discussed with daughter at bedside Disposition Plan: Pending GI clearance and evaluation by PT and OT; PT OT still need to  see the patient and anticipating discharging to SNF  Status is: Inpatient  Remains inpatient appropriate because:Unsafe d/c plan, IV treatments appropriate due to intensity of illness or inability to take PO and Inpatient level of care appropriate due to severity of illness   Dispo: The patient is from: Home              Anticipated d/c is to: TBD              Patient currently is not medically stable to d/c.   Difficult to place patient No  Consultants:   Gastroenterology   Procedures: NM Bleeding Scan   Antimicrobials:  Anti-infectives (From admission, onward)   None        Subjective: Seen and examined at bedside and states that her right hip was hurting again.  She is on the bedpan this morning.  No nausea or vomiting.  Blood count dropped again so she was typed and screened and transfused another 1 unit.  In the afternoon she spiked a temperature 100.8.  No other concerns or complaints at this time and family at bedside.  We will need to ensure that her hemoglobin stabilizes prior to safe discharge disposition and will need PT and OT to further evaluate and treat.  Objective: Vitals:   08/29/20 1238 08/29/20 1349 08/29/20 1532 08/29/20 1602  BP: 132/68  110/66 127/67  Pulse: 83  81 84  Resp: 19  18 16  Temp: (!) 100.8 F (38.2 C) (!) 100.6 F (38.1 C) 99.8 F (37.7 C) 98.2 F (36.8 C)  TempSrc: Oral Oral Oral Oral  SpO2: 99%  98% 98%  Weight:      Height:        Intake/Output Summary (Last 24 hours) at 08/29/2020 1747 Last data filed at 08/29/2020 1448 Gross per 24 hour  Intake 569.69 ml  Output --  Net 569.69 ml   Filed Weights   08/27/20 1900 08/29/20 0500  Weight: 45 kg 55.6 kg   Examination: Physical Exam:  Constitutional: Thin chronically ill-appearing elderly Caucasian female who appears slightly uncomfortable Eyes: Lids and conjunctivae normal, sclerae anicteric  ENMT: External Ears, Nose appear normal. Grossly normal hearing.  Neck: Appears  normal, supple, no cervical masses, normal ROM, no appreciable thyromegaly, no JVD Respiratory: Diminished to auscultation bilaterally with coarse breath sounds, no wheezing, rales, rhonchi or crackles. Normal respiratory effort and patient is not tachypenic. No accessory muscle use.  Wearing supplemental oxygen via nasal cannula Cardiovascular: RRR, no murmurs / rubs / gallops. S1 and S2 auscultated. No extremity edema. Abdomen: Soft, non-tender, non-distended. Bowel sounds positive.  GU: Deferred. Musculoskeletal: No clubbing / cyanosis of digits/nails. No joint deformity upper and lower extremities.  Skin: No rashes, lesions, ulcers on limited skin evaluation. No induration; Warm and dry.  Neurologic: CN 2-12 grossly intact with no focal deficits. Romberg sign and cerebellar reflexes not assessed.  Psychiatric: Normal judgment and insight. Alert and oriented x 3. Normal mood and appropriate affect.   Data Reviewed: I have personally reviewed following labs and imaging studies  CBC: Recent Labs  Lab 08/27/20 2034 08/28/20 0448 08/28/20 0847 08/28/20 1729 08/29/20 0449  WBC 5.8 5.3  --  6.1 5.8  NEUTROABS  --   --   --  4.1 3.4  HGB 6.8*  6.8* 6.4* 6.5* 7.9* 7.3*  HCT 22.5*  22.3* 21.3* 21.3* 24.7* 23.1*  MCV 94.1 95.1  --  91.1 90.2  PLT 175 175  --  182 185   Basic Metabolic Panel: Recent Labs  Lab 08/27/20 2034 08/28/20 0448 08/29/20 0449  NA 139 140 137  K 3.9 3.7 3.9  CL 105 108 104  CO2 27 25 22   GLUCOSE 102* 94 84  BUN 16 16 16   CREATININE 1.36* 1.27* 1.08*  CALCIUM 8.1* 8.1* 8.2*  MG 1.8 1.7 1.7  PHOS  --   --  3.0   GFR: Estimated Creatinine Clearance: 39.5 mL/min (A) (by C-G formula based on SCr of 1.08 mg/dL (H)). Liver Function Tests: Recent Labs  Lab 08/27/20 2034 08/29/20 0449  AST 28 31  ALT 12 13  ALKPHOS 82 76  BILITOT 0.6 0.8  PROT 5.1* 4.9*  ALBUMIN 2.7* 2.3*   No results for input(s): LIPASE, AMYLASE in the last 168 hours. No results  for input(s): AMMONIA in the last 168 hours. Coagulation Profile: Recent Labs  Lab 08/27/20 2034 08/28/20 0448  INR 1.4* 1.2   Cardiac Enzymes: No results for input(s): CKTOTAL, CKMB, CKMBINDEX, TROPONINI in the last 168 hours. BNP (last 3 results) No results for input(s): PROBNP in the last 8760 hours. HbA1C: No results for input(s): HGBA1C in the last 72 hours. CBG: No results for input(s): GLUCAP in the last 168 hours. Lipid Profile: No results for input(s): CHOL, HDL, LDLCALC, TRIG, CHOLHDL, LDLDIRECT in the last 72 hours. Thyroid Function Tests: Recent Labs    08/28/20 0448  TSH 0.638   Anemia Panel:  Recent Labs    08/27/20 2034 08/28/20 0448  FOLATE  --  9.9  FERRITIN  --  48  TIBC  --  232*  IRON  --  13*  RETICCTPCT 2.4  --    Sepsis Labs: No results for input(s): PROCALCITON, LATICACIDVEN in the last 168 hours.  No results found for this or any previous visit (from the past 240 hour(s)).   RN Pressure Injury Documentation:     Estimated body mass index is 21.71 kg/m as calculated from the following:   Height as of this encounter: 5\' 3"  (1.6 m).   Weight as of this encounter: 55.6 kg.  Malnutrition Type: Nutrition Problem: Inadequate oral intake Etiology: inability to eat Malnutrition Characteristics: Signs/Symptoms: NPO status Nutrition Interventions: Interventions: Refer to RD note for recommendations  Radiology Studies: NM GI Blood Loss  Result Date: 08/28/2020 CLINICAL DATA:  70 year old with GI bleed.  Left abdominal pain. EXAM: NUCLEAR MEDICINE GASTROINTESTINAL BLEEDING SCAN TECHNIQUE: Sequential abdominal images were obtained following intravenous administration of Tc-64m labeled red blood cells. RADIOPHARMACEUTICALS:  24.3 mCi Tc-31m pertechnetate in-vitro labeled red cells. COMPARISON:  Abdominal ultrasound 05/27/2019 FINDINGS: Expected uptake in the vascular structures. No uptake within the GI tract. IMPRESSION: Negative for active GI  bleeding. Electronically Signed   By: Markus Daft M.D.   On: 08/28/2020 16:40    Scheduled Meds: . sodium chloride   Intravenous Once  . atorvastatin  40 mg Oral Daily  . levothyroxine  75 mcg Oral QAC breakfast  . pantoprazole (PROTONIX) IV  40 mg Intravenous Q12H   Continuous Infusions: . lactated ringers 50 mL/hr at 08/28/20 1805    LOS: 2 days   Kerney Elbe, DO [facility] PAGER is on AMION  If 7PM-7AM, please contact night-coverage [email or web address]

## 2020-08-29 NOTE — Progress Notes (Signed)
Blood Transfusion completed Pt tolerated transfusion without difficulty. Low grade temp 99.8 but vitals otherwise stable and no acute changes noted in Pt's assessment. Maintain plan of care and monitor Pt closely

## 2020-08-29 NOTE — Progress Notes (Signed)
Per MD ok to start blood transfusion. Will monitor Pt closely.

## 2020-08-30 LAB — TYPE AND SCREEN
ABO/RH(D): O POS
Antibody Screen: NEGATIVE
Unit division: 0
Unit division: 0

## 2020-08-30 LAB — CBC WITH DIFFERENTIAL/PLATELET
Abs Immature Granulocytes: 0.03 10*3/uL (ref 0.00–0.07)
Basophils Absolute: 0 10*3/uL (ref 0.0–0.1)
Basophils Relative: 0 %
Eosinophils Absolute: 0.2 10*3/uL (ref 0.0–0.5)
Eosinophils Relative: 4 %
HCT: 29.2 % — ABNORMAL LOW (ref 36.0–46.0)
Hemoglobin: 9.1 g/dL — ABNORMAL LOW (ref 12.0–15.0)
Immature Granulocytes: 1 %
Lymphocytes Relative: 28 %
Lymphs Abs: 1.5 10*3/uL (ref 0.7–4.0)
MCH: 27.7 pg (ref 26.0–34.0)
MCHC: 31.2 g/dL (ref 30.0–36.0)
MCV: 88.8 fL (ref 80.0–100.0)
Monocytes Absolute: 0.5 10*3/uL (ref 0.1–1.0)
Monocytes Relative: 8 %
Neutro Abs: 3.2 10*3/uL (ref 1.7–7.7)
Neutrophils Relative %: 59 %
Platelets: 214 10*3/uL (ref 150–400)
RBC: 3.29 MIL/uL — ABNORMAL LOW (ref 3.87–5.11)
RDW: 17.7 % — ABNORMAL HIGH (ref 11.5–15.5)
WBC: 5.4 10*3/uL (ref 4.0–10.5)
nRBC: 0 % (ref 0.0–0.2)

## 2020-08-30 LAB — COMPREHENSIVE METABOLIC PANEL
ALT: 29 U/L (ref 0–44)
AST: 57 U/L — ABNORMAL HIGH (ref 15–41)
Albumin: 2.5 g/dL — ABNORMAL LOW (ref 3.5–5.0)
Alkaline Phosphatase: 82 U/L (ref 38–126)
Anion gap: 10 (ref 5–15)
BUN: 12 mg/dL (ref 8–23)
CO2: 25 mmol/L (ref 22–32)
Calcium: 8.6 mg/dL — ABNORMAL LOW (ref 8.9–10.3)
Chloride: 107 mmol/L (ref 98–111)
Creatinine, Ser: 0.99 mg/dL (ref 0.44–1.00)
GFR, Estimated: >60 mL/min (ref >60)
Glucose, Bld: 96 mg/dL (ref 70–99)
Potassium: 3 mmol/L — ABNORMAL LOW (ref 3.5–5.1)
Sodium: 142 mmol/L (ref 135–145)
Total Bilirubin: 0.8 mg/dL (ref 0.3–1.2)
Total Protein: 5.3 g/dL — ABNORMAL LOW (ref 6.5–8.1)

## 2020-08-30 LAB — BPAM RBC
Blood Product Expiration Date: 202205162359
Blood Product Expiration Date: 202205192359
ISSUE DATE / TIME: 202204220941
ISSUE DATE / TIME: 202204231541
Unit Type and Rh: 5100
Unit Type and Rh: 5100

## 2020-08-30 LAB — PHOSPHORUS: Phosphorus: 3 mg/dL (ref 2.5–4.6)

## 2020-08-30 LAB — MAGNESIUM: Magnesium: 1.7 mg/dL (ref 1.7–2.4)

## 2020-08-30 MED ORDER — POTASSIUM CHLORIDE CRYS ER 20 MEQ PO TBCR
40.0000 meq | EXTENDED_RELEASE_TABLET | Freq: Two times a day (BID) | ORAL | Status: AC
  Administered 2020-08-30 (×2): 40 meq via ORAL
  Filled 2020-08-30 (×2): qty 2

## 2020-08-30 MED ORDER — ONDANSETRON HCL 4 MG/2ML IJ SOLN
4.0000 mg | Freq: Four times a day (QID) | INTRAMUSCULAR | Status: DC | PRN
  Administered 2020-08-30 (×2): 4 mg via INTRAVENOUS
  Filled 2020-08-30 (×2): qty 2

## 2020-08-30 MED ORDER — MAGNESIUM SULFATE 2 GM/50ML IV SOLN
2.0000 g | Freq: Once | INTRAVENOUS | Status: AC
  Administered 2020-08-30: 2 g via INTRAVENOUS
  Filled 2020-08-30: qty 50

## 2020-08-30 NOTE — Plan of Care (Signed)
  Problem: Activity: Goal: Risk for activity intolerance will decrease Outcome: Progressing   Problem: Pain Managment: Goal: General experience of comfort will improve Outcome: Progressing   Problem: Education: Goal: Knowledge of General Education information will improve Description: Including pain rating scale, medication(s)/side effects and non-pharmacologic comfort measures Outcome: Completed/Met   Problem: Nutrition: Goal: Adequate nutrition will be maintained Outcome: Completed/Met   Problem: Elimination: Goal: Will not experience complications related to bowel motility Outcome: Completed/Met

## 2020-08-30 NOTE — Evaluation (Signed)
Occupational Therapy Evaluation Patient Details Name: Carolyn Drake MRN: [id number] DOB: 1950/12/15 Today's Date: 08/30/2020    History of Present Illness Carolyn Drake is a 70 y.o. female with medical history significant for hypertension, hyperlipidemia, GERD, stage IV lung cancer, collagenous colitis diagnosed in 2018, chronic iron deficiency anemia with baseline hemoglobin of 10, acquired hypothyroidism, who is admitted to [facility] for further evaluation and management of subacute lower gastrointestinal bleed by way of transfer from [place] on 4/19 and underwent R ORIF.   Clinical Impression   Carolyn Drake presents s/p hip surgery with decreased ROM and strength of RLE, generalized weakness, decreased activity tolerance, impaired balance and pain resulting in a sudden decline in functional abilities. Patient normally independent at home with her husband. Patient needing min guard for ambulation, ambulation limited to in room and needing assistance for LB ADLs and seated positioning for UB ADLs. Patient will benefit from skilled OT services while in hospital to improve deficits and learn compensatory strategies as needed in order to return to PLOF.  Recommend short term rehab at discharge.    Follow Up Recommendations  SNF    Equipment Recommendations  Other (comment) (defer to next venue)    Recommendations for Other Services       Precautions / Restrictions Precautions Precautions: Fall Precaution Comments: Reports multiple falls at home. Restrictions Weight Bearing Restrictions: Yes RLE Weight Bearing: Weight bearing as tolerated      Mobility Bed Mobility Overal bed mobility: Needs Assistance Bed Mobility: Supine to Sit     Supine to sit: Supervision     General bed mobility comments: increased time to transfer to side of bed.    Transfers Overall transfer level: Needs assistance Equipment used: Rolling walker (2 wheeled) Transfers: Sit  to/from Stand Sit to Stand: Min guard         General transfer comment: Min guard to ambulate in room on RA with RW. o2 sats maintained above 92%.    Balance Overall balance assessment: Needs assistance Sitting-balance support: No upper extremity supported Sitting balance-Leahy Scale: Good     Standing balance support: During functional activity Standing balance-Leahy Scale: Fair                             ADL either performed or assessed with clinical judgement   ADL Overall ADL's : Needs assistance/impaired Eating/Feeding: Independent   Grooming: Set up;Sitting   Upper Body Bathing: Set up;Sitting   Lower Body Bathing: Sit to/from stand;Minimal assistance   Upper Body Dressing : Set up;Sitting   Lower Body Dressing: Moderate assistance;Sit to/from stand   Toilet Transfer: Min guard;BSC;RW   Toileting- Water quality scientist and Hygiene: Minimal assistance;Sit to/from stand               Vision   Vision Assessment?: No apparent visual deficits     Perception     Praxis      Pertinent Vitals/Pain Pain Assessment: Faces Faces Pain Scale: Hurts little more Pain Location: R hip Pain Descriptors / Indicators: Aching;Grimacing Pain Intervention(s): Monitored during session;Patient requesting pain meds-RN notified     Hand Dominance Right   Extremity/Trunk Assessment Upper Extremity Assessment Upper Extremity Assessment: Overall WFL for tasks assessed   Lower Extremity Assessment Lower Extremity Assessment: Defer to PT evaluation   Cervical / Trunk Assessment Cervical / Trunk Assessment: Normal   Communication Communication Communication: No difficulties   Cognition Arousal/Alertness: Awake/alert Behavior During Therapy:  WFL for tasks assessed/performed Overall Cognitive Status: Within Functional Limits for tasks assessed                                     General Comments       Exercises     Shoulder  Instructions      Home Living Family/patient expects to be discharged to:: Private residence Living Arrangements: Spouse/significant other Available Help at Discharge: Family Type of Home: House Home Access: Stairs to enter Entrance Stairs-Number of Steps: 1   Home Layout: Two level;Other (Comment) (laundry in the basement)     Bathroom Shower/Tub: Tub/shower unit;Curtain   Bathroom Toilet: Standard     Home Equipment: Environmental consultant - 4 wheels;Cane - single point          Prior Functioning/Environment Level of Independence: Independent        Comments: oxygen at night.        OT Problem List: Decreased strength;Decreased range of motion;Decreased activity tolerance;Impaired balance (sitting and/or standing);Decreased knowledge of use of DME or AE;Pain;Cardiopulmonary status limiting activity      OT Treatment/Interventions: Self-care/ADL training;Therapeutic exercise;DME and/or AE instruction;Therapeutic activities;Balance training;Patient/family education    OT Goals(Current goals can be found in the care plan section) Acute Rehab OT Goals Patient Stated Goal: get stronger, quit falling OT Goal Formulation: With patient Time For Goal Achievement: 10/18/20 Potential to Achieve Goals: Good  OT Frequency: Min 2X/week   Barriers to D/C:            Co-evaluation              AM-PAC OT "6 Clicks" Daily Activity     Outcome Measure Help from another person eating meals?: None Help from another person taking care of personal grooming?: A Little Help from another person toileting, which includes using toliet, bedpan, or urinal?: A Little Help from another person bathing (including washing, rinsing, drying)?: A Little Help from another person to put on and taking off regular upper body clothing?: A Little Help from another person to put on and taking off regular lower body clothing?: A Lot 6 Click Score: 18   End of Session Equipment Utilized During Treatment:  Rolling walker;Gait belt Nurse Communication: Mobility status  Activity Tolerance: Patient tolerated treatment well Patient left: in chair;with call bell/phone within reach;with chair alarm set  OT Visit Diagnosis: Other abnormalities of gait and mobility (R26.89);Pain;Muscle weakness (generalized) (M62.81) Pain - Right/Left: Right Pain - part of body: Hip                Time: 0177-9390 OT Time Calculation (min): 20 min Charges:  OT General Charges $OT Visit: 1 Visit OT Evaluation $OT Eval Low Complexity: 1 Low  Amarilys Lyles, OTR/L [place]  Office [phone] Pager: [place] 08/30/2020, 11:02 AM

## 2020-08-30 NOTE — Progress Notes (Signed)
PROGRESS NOTE    Carolyn Drake  SAY:[id number] DOB: 1951/02/02 DOA: 08/27/2020 PCP: Kendrick Ranch, MD   Brief Narrative:  HPI per Dr. Babs Bertin on 08/27/20 Carolyn Drake is a 70 y.o. female with medical history significant for hypertension, hyperlipidemia, GERD, stage IV lung cancer, collagenous colitis diagnosed in 2018, chronic iron deficiency anemia with baseline hemoglobin of 10, acquired hypothyroidism, who is admitted to [facility] for further evaluation and management of subacute lower gastrointestinal bleed by way of transfer from [facility].   The patient has been experiencing intermittent bright red blood per rectum over the course of the last 6 weeks.  In this context, she was seen by [facility] gastroenterology on 08/13/2020 for further evaluation of this rectal bleeding.  She conveys that approximately half of her bowel movements over the course of the last 6 months have been associated with bright red blood per rectum, but denies any associated melena.  Denies any significant recent worsening in the frequency of this hematochezia.  Denies any significant abdominal discomfort.  She reports chronic diarrhea, without significant change in frequency thereof.   Per evaluation by [facility] gastroenterology on 08/13/2020, they recommended pursuit of an outpatient colonoscopy.  The patient was in the process of getting scheduled for this endoscopic evaluation when she experienced a mechanical fall resulting in right hip fracture and hospitalization to the hospital service at [facility] on 08/25/2020.  Subsequently, orthopedic surgery was consulted, and she underwent ORIF of the right hip.No reported intraoperative complications. Postoperatively, she was started on Xarelto for thromboembolic prophylaxis, before subsequently developing evidence of a acute gastrointestinal bleed. Over the course of her hospitalization at [facility], her initial hemoglobin decreased  from 9.4-7.5, and fecal occult blood testing performed on 08/27/2020 was found to be positive. Xarelto was held at that time, and no additional blood thinning agents have been administered,including no aspirin. Additionally, the patient was started on IV Protonix. In the context of [facility] providers,the patient was subsequently transferred to [facility] for further evaluation and management of her subacute lower gastrointestinal bleed. Of note, her most recent set of vital signs prior to transfer from [facility] were noted to be as follows: Blood pressure 109/53, heart rate 105, respiratory rate 16, and oxygen saturation 93% on room air. She was subsequently accepted for admission to the med telemetry floor at [facility].   Of note, it appears that the patient's baseline hemoglobin is approximately 10, with such value noted in February 2022.  She underwent colonoscopy in 2018, at which time biopsies diagnosed her with collagenous colitis in April 2018.  She also underwent EGD in August 2017 with associated gastric and duodenal biopsies revealing benign findings.  As an outpatient she is on daily baby aspirin.   She denies any associated chest pain, shortness of breath, dizziness, presyncope, or syncope.  Not associate with any recent subjective fever, chills, rigors, or generalized myalgias.  Aside from recent ground-level mechanical fall that resulted in her right hip fracture, she denies any recent or preceding trauma.  **Interim History GI was consulted for further evaluation and she is typed and screened and transfused 1 unit PRBCs.  GI recommended colonoscopy but patient has adamantly refused to have prep so now GI recommended nuclear medicine bleeding scan and if it is positive having IR be consulted for possible embolization.  Nuclear medicine bleeding scan was negative for any acute bleeding so she was advanced on her diet.  On  08/29/20  her hemoglobin dropped little bit more and was 7.2 this morning so she is typed and screened and transfused anothjer 1 unit PRBCs.  Prior to her getting a blood she spiked a temperature of 100.8 so blood cultures x2 are ordered.  We will need to monitor and will need PT OT to further evaluate and treat and they are recommending SNF.   Assessment & Plan:   Principal Problem:   Acute lower GI bleeding Active Problems:   GERD (gastroesophageal reflux disease)   Acute on chronic blood loss anemia   HLD (hyperlipidemia)   HTN (hypertension)  Acute/Subacute GI Bleeding with bright red blood per rectum and rectal Bleeding and Anemia Hx of Collagenous Colitis  Hx of GIST Tumor -The patient has been experiencing intermittent bright red blood per rectum over the course of the last 6 weeks, following with outpatient gastroenterology over that time, with ensuing recommendation for outpatient colonoscopy, which was in the process of being scheduled with the patient experienced a mechanical fall leading to right hip fracture and ensuing hospitalization at [facility], -She has continued to experience intermittent episodes of bright red blood per rectum and absence of any melena, without significant increase in the frequency of such episodes.   -This appears to be associated with acute on chronic iron deficiency anemia, with presenting hemoglobin of 6.8 relative to baseline hemoglobin of 10.1 in February 2022, and similar to most recent prior value at [facility] prior to transfer, which was noted to be 7.5.   -Given the duration over which the patient has been experiencing bright red blood per rectum, suspect a very slow versus intermittent lower gastrointestinal bleed.  -She appears hemodynamically stable and otherwise asymptomatic.  -She is FOBT positive [facility] and was recently using Xarelto -Hemoglobin/hematocrit was 6.8/22.3 -> 6.4/21.3 has had a history of intermittent rectal bleeding  for the last year or so and repeat was 6.5/21.3  -She was typed and screened and transfused 2 units of PRBCs repeat hemoglobin/hematocrit further improved to 8.9/20.1 yesterday evening and this morning was 9.1/29.2 and stable -Anemia panel done and showed an iron level of 13, U IBC of 219, TIBC of 232, saturation ratios of 6%, ferritin level 48, folate of 9.9 -INR was 1.2 -Currently holding her anticoagulation prophylaxis for her surgical intervention and also holding aspirin 81 mg p.o. daily -Patient has declined colonoscopy due to issues with prep and GI as discussed about getting multiple options of getting colonoscopy prep which is declined -GI Has now recommending a bleeding scan for evaluation of rectal bleeding and anemia and if it is positive recommending interventional consult for consideration of embolization but this was NEGATIVE -Continue to monitor and trend hemoglobin and hematocrit -If GI bleeding scan is negative they are recommending her follow-up with her primary gastroenterologist as an outpatient for further work-up for chronic anemia and GI bleed -Continue monitor for further signs or symptoms of bleeding and currently not having any  Acute on chronic iron deficiency anemia -She has a documented history of chronic iron deficiency anemia with a baseline hemoglobin being 10 as recent of February 2022 and she remained on a daily oral iron supplement -Globin has been trending down slowly and she has had intermittent bright red blood per rectum for last 6 weeks -Above but she is typed and screened and transfused 2 unit of PRBCs total and will continue to transfuse less than 7 -Repeat INR yesterday was 1.2 -We will continue monitor on telemetry -We will refrain from any  pharmacological anticoagulation for now and continue with SCDs -GI is following and have offered the patient a colonoscopy but she has refused  Essential hypertension -She is on metoprolol as an outpatient and in  the setting of her subacute GI bleed we will hold antihypertensives for now -Continue monitor blood pressures per protocol -Last blood pressure reading was 136/69  Hyperlipidemia -She is on a high intensity atorvastatin as an outpatient -Statin was held on admission but if she tolerates a diet will resume; diet advancement per GI -She takes atorvastatin 40 mg p.o. daily and will resume once able  GERD -Takes pantoprazole as an outpatient -Currently switched to IV PPI twice daily  Acquired Hypothyroidism -Resume Levothyroxine 75 mcg po Daily   AKI -In the setting of GI bleeding -Patient's BUNs/creatinine went from 12/1.65 -> 16/1.36 -> 16/1.27 -> 16/1.08 -> 12/0.99 -Received 2 unit PRBCs and was getting lactated Ringer's at 50 MLS per hour but will stop now -Avoid Nephrotoxic medications, contrast dyes, hypotension renally dose medications -Repeat CMP in a.m.  Stage IV Lung Cancer  COPD -Wearing 2 Liters of O2 -Has Supplemental O2 and wears it when needed -SpO2: 95 % O2 Flow Rate (L/min): 2 L/min -Currently not wheezing or in Exacerbation  Hypokalemia -Patient's potassium this morning 3.0 -Replete with p.o. potassium chloride 40 mg twice daily x2 doses -Mag level 1.7 and will replete with IV Mag Sulfate 2 grams -Repeat CMP in a.m.  Elevated AST -Mild and Likely reactive -Patient's AST went from 31 -> 57 -Continue to Monitor and Trend -Repeat CMP in the AM   Right Hip Fx -Recent ORIF at OSH -Was started on Xarelto for thromboembolic prophylaxis but now held due to GI Bleeding and will need to start DVT prophylaxis and will give her Xarelto 30 mg subcu daily -Will need PT/OT to evaluate and Treat when safe from a GI standpoint and the prior orthopedic surgery notes from outside hospital recommending weightbearing as tolerated -Continues to have hip pain and will add pain regimen with p.o. tramadol and p.o. oxycodone in addition to her acetaminophen  Fever,  improved -Unclear etiology -Spiked a temperature of 100.8 yesterday -Obtain blood cultures x2 and these are pending -Continue to monitor for signs and symptoms of infection; currently no overt infection noted -Repeat CBC in the a.m. and continue monitor and trend temperature curve  Underweight -Nutritionist consulted and will make diet recommendations now that diet is advanced  DVT prophylaxis: SCDs Code Status: FULL CODE Family Communication: Discussed with daughter at bedside Disposition Plan: Pending GI clearance and evaluation by PT and OT; PT OT still need to see the patient and anticipating discharging to SNF  Status is: Inpatient  Remains inpatient appropriate because:Unsafe d/c plan, IV treatments appropriate due to intensity of illness or inability to take PO and Inpatient level of care appropriate due to severity of illness   Dispo: The patient is from: Home              Anticipated d/c is to: SNF              Patient currently is not medically stable to d/c.   Difficult to place patient No  Consultants:   Gastroenterology   Procedures: NM Bleeding Scan   Antimicrobials:  Anti-infectives (From admission, onward)   None        Subjective: Seen and examined at bedside and her blood count was stable.  She was little bit nauseous today and still have some right sided hip pain.  No lightheadedness or dizziness.  No other concerns or complaints at this time.  PT OT recommending SNF and she states that she was scheduled to go to [facility] in [place] prior to getting transferred to [facility] for her bleeding.  Objective: Vitals:   08/29/20 1808 08/29/20 2141 08/30/20 0523 08/30/20 1245  BP: 129/71 129/67 (!) 151/63 135/69  Pulse: 72 70 76 74  Resp: 18  16 19   Temp: 99.2 F (37.3 C) 98.2 F (36.8 C) 98.8 F (37.1 C) 98.8 F (37.1 C)  TempSrc: Oral Oral Oral Oral  SpO2: 99% 100% 97% 95%  Weight:   45.8 kg   Height:        Intake/Output Summary (Last  24 hours) at 08/30/2020 1448 Last data filed at 08/30/2020 1345 Gross per 24 hour  Intake 862.37 ml  Output --  Net 862.37 ml   Filed Weights   08/27/20 1900 08/29/20 0500 08/30/20 0523  Weight: 45 kg 55.6 kg 45.8 kg   Examination: Physical Exam:  Constitutional: Thin chronically ill-appearing elderly Caucasian female appears uncomfortable again still complaining of some right-sided hip pain Eyes: Lids and conjunctivae normal, sclerae anicteric  ENMT: External Ears, Nose appear normal. Grossly normal hearing.  Neck: Appears normal, supple, no cervical masses, normal ROM, no appreciable thyromegaly; no JVD Respiratory: Diminished to auscultation bilaterally with coarse breath sounds, no wheezing, rales, rhonchi or crackles. Normal respiratory effort and patient is not tachypenic. No accessory muscle use.  Wearing supplemental oxygen via nasal cannula Cardiovascular: RRR, no murmurs / rubs / gallops. S1 and S2 auscultated. No extremity edema.  Abdomen: Soft, non-tender, non-distended. Bowel sounds positive.  GU: Deferred. Musculoskeletal: No clubbing / cyanosis of digits/nails. No joint deformity upper and lower extremities.  Skin: No rashes, lesions, ulcers on limited skin evaluation. No induration; Warm and dry.  Neurologic: CN 2-12 grossly intact with no focal deficits. Romberg sign and cerebellar reflexes not assessed.  Psychiatric: Normal judgment and insight. Alert and oriented x 3. Normal mood and appropriate affect.   Data Reviewed: I have personally reviewed following labs and imaging studies  CBC: Recent Labs  Lab 08/28/20 0448 08/28/20 0847 08/28/20 1729 08/29/20 0449 08/29/20 2110 08/30/20 0701  WBC 5.3  --  6.1 5.8 5.5 5.4  NEUTROABS  --   --  4.1 3.4 3.4 3.2  HGB 6.4* 6.5* 7.9* 7.3* 8.9* 9.1*  HCT 21.3* 21.3* 24.7* 23.1* 28.1* 29.2*  MCV 95.1  --  91.1 90.2 90.1 88.8  PLT 175  --  182 182 190 390   Basic Metabolic Panel: Recent Labs  Lab 08/27/20 2034  08/28/20 0448 08/29/20 0449 08/30/20 0701  NA 139 140 137 142  K 3.9 3.7 3.9 3.0*  CL 105 108 104 107  CO2 27 25 22 25   GLUCOSE 102* 94 84 96  BUN 16 16 16 12   CREATININE 1.36* 1.27* 1.08* 0.99  CALCIUM 8.1* 8.1* 8.2* 8.6*  MG 1.8 1.7 1.7 1.7  PHOS  --   --  3.0 3.0   GFR: Estimated Creatinine Clearance: 37.7 mL/min (by C-G formula based on SCr of 0.99 mg/dL). Liver Function Tests: Recent Labs  Lab 08/27/20 2034 08/29/20 0449 08/30/20 0701  AST 28 31 57*  ALT 12 13 29   ALKPHOS 82 76 82  BILITOT 0.6 0.8 0.8  PROT 5.1* 4.9* 5.3*  ALBUMIN 2.7* 2.3* 2.5*   No results for input(s): LIPASE, AMYLASE in the last 168 hours. No results for input(s): AMMONIA in the last  168 hours. Coagulation Profile: Recent Labs  Lab 08/27/20 2034 08/28/20 0448  INR 1.4* 1.2   Cardiac Enzymes: No results for input(s): CKTOTAL, CKMB, CKMBINDEX, TROPONINI in the last 168 hours. BNP (last 3 results) No results for input(s): PROBNP in the last 8760 hours. HbA1C: No results for input(s): HGBA1C in the last 72 hours. CBG: No results for input(s): GLUCAP in the last 168 hours. Lipid Profile: No results for input(s): CHOL, HDL, LDLCALC, TRIG, CHOLHDL, LDLDIRECT in the last 72 hours. Thyroid Function Tests: Recent Labs    08/28/20 0448  TSH 0.638   Anemia Panel: Recent Labs    08/27/20 2034 08/28/20 0448  FOLATE  --  9.9  FERRITIN  --  48  TIBC  --  232*  IRON  --  13*  RETICCTPCT 2.4  --    Sepsis Labs: No results for input(s): PROCALCITON, LATICACIDVEN in the last 168 hours.  Recent Results (from the past 240 hour(s))  Culture, blood (routine x 2)     Status: None (Preliminary result)   Collection Time: 08/29/20  2:31 PM   Specimen: BLOOD  Result Value Ref Range Status   Specimen Description   Final    BLOOD RIGHT ANTECUBITAL Performed at [facility] [address], [place], [place] [address]    Special Requests   Final    BOTTLES DRAWN AEROBIC ONLY  Blood Culture adequate volume Performed at [facility] [address], [place], [place] [address]    Culture   Final    NO GROWTH < 24 HOURS Performed at [facility] [address] [place], [place] [address]    Report Status PENDING  Incomplete  Culture, blood (routine x 2)     Status: None (Preliminary result)   Collection Time: 08/29/20  2:36 PM   Specimen: BLOOD  Result Value Ref Range Status   Specimen Description   Final    BLOOD BLOOD RIGHT FOREARM Performed at [facility] [address], [place], [place] [address]    Special Requests   Final    BOTTLES DRAWN AEROBIC AND ANAEROBIC Blood Culture adequate volume Performed at [facility] [address], [place],  [place] [address]    Culture   Final    NO GROWTH < 24 HOURS Performed at [facility] [address] [place], [place] [address]    Report Status PENDING  Incomplete     RN Pressure Injury Documentation:     Estimated body mass index is 17.89 kg/m as calculated from the following:   Height as of this encounter: 5\' 3"  (1.6 m).   Weight as of this encounter: 45.8 kg.  Malnutrition Type: Nutrition Problem: Inadequate oral intake Etiology: inability to eat Malnutrition Characteristics: Signs/Symptoms: NPO status Nutrition Interventions: Interventions: Refer to RD note for recommendations  Radiology Studies: NM GI Blood Loss  Result Date: 08/28/2020 CLINICAL DATA:  70 year old with GI bleed.  Left abdominal pain. EXAM: NUCLEAR MEDICINE GASTROINTESTINAL BLEEDING SCAN TECHNIQUE: Sequential abdominal images were obtained following intravenous administration of Tc-47m labeled red blood cells. RADIOPHARMACEUTICALS:  24.3 mCi Tc-33m pertechnetate in-vitro labeled red cells. COMPARISON:  Abdominal ultrasound 05/27/2019 FINDINGS: Expected uptake in the vascular structures. No uptake within the GI tract. IMPRESSION: Negative for active GI  bleeding. Electronically Signed   By: Markus Daft M.D.   On: 08/28/2020 16:40    Scheduled Meds: . sodium chloride   Intravenous Once  . atorvastatin  40 mg  Oral Daily  . levothyroxine  75 mcg Oral QAC breakfast  . pantoprazole (PROTONIX) IV  40 mg Intravenous Q12H  . potassium chloride  40 mEq Oral BID   Continuous Infusions:   LOS: 3 days   Kerney Elbe, DO [facility] PAGER is on AMION  If 7PM-7AM, please contact night-coverage [email or web address]

## 2020-08-30 NOTE — Evaluation (Signed)
Physical Therapy Evaluation Patient Details Name: Carolyn Drake MRN: [id number] DOB: [birth year] Today's Date: 08/30/2020   History of Present Illness  70 yo female admitted to [facility] by way of transfer from [facility] with acute GI bleed. S/P R hip ORIF at [facility] on 08/25/20 after sustaining a fall. Hx of stage IV lung Ca, COPD, MI, anemia, hypothyroidism, anemia, colitis  Clinical Impression  On eval, pt required Min A for mobility. She walked ~100 feet with a RW. Moderate pain with activity. Pt presents with general weakness, decreased activity tolerance, and impaired gait and balance. Feel pt could benefit from a short rehab stay at SNF if she is agreeable. Will plan to follow and progress activity as tolerated.     Follow Up Recommendations SNF (HHPT if pt/family decline placement)    Equipment Recommendations  Rolling walker with 5" wheels    Recommendations for Other Services       Precautions / Restrictions Precautions Precautions: Fall Precaution Comments: Reports multiple falls at home. Restrictions Weight Bearing Restrictions: No RLE Weight Bearing: Weight bearing as tolerated Other Position/Activity Restrictions: WBAT per Dr Alfredia Ferguson      Mobility  Bed Mobility Overal bed mobility: Needs Assistance Bed Mobility: Sit to Supine     Sit to supine: Min assist   General bed mobility comments: A for LEs.    Transfers Overall transfer level: Needs assistance Equipment used: Rolling walker (2 wheeled) Transfers: Sit to/from Stand Sit to Stand: Min assist         General transfer comment: Min A from low toilet, Min guard A from recliner. Cues for safety, technique, hand plaement. Increased time.  Ambulation/Gait Ambulation/Gait assistance: Min assist Gait Distance (Feet): 100 Feet Assistive device: Rolling walker (2 wheeled) Gait Pattern/deviations: Step-to pattern;Trunk flexed     General Gait Details: A to stabilize intermittently. Slow gait speed. Pt  tolerated distance well. Mild lightheadedness reported.  Stairs            Wheelchair Mobility    Modified Rankin (Stroke Patients Only)       Balance Overall balance assessment: Needs assistance;History of Falls Sitting-balance support: No upper extremity supported Sitting balance-Leahy Scale: Good     Standing balance support: Bilateral upper extremity supported Standing balance-Leahy Scale: Poor                               Pertinent Vitals/Pain Pain Assessment: 0-10 Pain Score: 7  Faces Pain Scale: Hurts little more Pain Location: R hip Pain Descriptors / Indicators: Grimacing;Guarding;Discomfort;Sore Pain Intervention(s): Limited activity within patient's tolerance;Monitored during session;Repositioned    Home Living Family/patient expects to be discharged to:: Private residence Living Arrangements: Spouse/significant other Available Help at Discharge: Family Type of Home: House Home Access: Stairs to enter   [employer] of Steps: 1 Home Layout: Two level;Other (Comment) (laundry in the basement) Home Equipment: Walker - 4 wheels;Cane - single point      Prior Function Level of Independence: Independent         Comments: oxygen at night.     Hand Dominance   Dominant Hand: Right    Extremity/Trunk Assessment   Upper Extremity Assessment Upper Extremity Assessment: Defer to OT evaluation    Lower Extremity Assessment Lower Extremity Assessment: Generalized weakness    Cervical / Trunk Assessment Cervical / Trunk Assessment: Kyphotic  Communication   Communication: No difficulties  Cognition Arousal/Alertness: Awake/alert Behavior During Therapy: WFL for tasks assessed/performed Overall  Cognitive Status: Within Functional Limits for tasks assessed                                        General Comments      Exercises     Assessment/Plan    PT Assessment Patient needs continued PT  services  PT Problem List Decreased strength;Decreased mobility;Decreased range of motion;Decreased activity tolerance;Decreased knowledge of use of DME;Decreased balance;Pain       PT Treatment Interventions DME instruction;Gait training;Balance training;Therapeutic exercise;Functional mobility training;Therapeutic activities;Patient/family education    PT Goals (Current goals can be found in the Care Plan section)  Acute Rehab PT Goals Patient Stated Goal: get stronger, quit falling PT Goal Formulation: With patient Time For Goal Achievement: 09/13/20 Potential to Achieve Goals: Good    Frequency Min 3X/week   Barriers to discharge Decreased caregiver support      Co-evaluation               AM-PAC PT "6 Clicks" Mobility  Outcome Measure Help needed turning from your back to your side while in a flat bed without using bedrails?: A Little Help needed moving from lying on your back to sitting on the side of a flat bed without using bedrails?: A Little Help needed moving to and from a bed to a chair (including a wheelchair)?: A Little Help needed standing up from a chair using your arms (e.g., wheelchair or bedside chair)?: A Little Help needed to walk in hospital room?: A Little Help needed climbing 3-5 steps with a railing? : A Lot 6 Click Score: 17    End of Session Equipment Utilized During Treatment: Gait belt Activity Tolerance: Patient tolerated treatment well Patient left: in bed;with call bell/phone within reach;with bed alarm set   PT Visit Diagnosis: Muscle weakness (generalized) (M62.81);History of falling (Z91.81);Pain    Time: 2355-7322 PT Time Calculation (min) (ACUTE ONLY): 23 min   Charges:   PT Evaluation $PT Eval Moderate Complexity: 1 Mod PT Treatments $Gait Training: 8-22 mins         Doreatha Massed, PT Acute Rehabilitation  Office: [phone] Pager: [phone]

## 2020-08-31 LAB — CBC WITH DIFFERENTIAL/PLATELET
Abs Immature Granulocytes: 0.01 10*3/uL (ref 0.00–0.07)
Basophils Absolute: 0 10*3/uL (ref 0.0–0.1)
Basophils Relative: 1 %
Eosinophils Absolute: 0.2 10*3/uL (ref 0.0–0.5)
Eosinophils Relative: 3 %
HCT: 28.4 % — ABNORMAL LOW (ref 36.0–46.0)
Hemoglobin: 9 g/dL — ABNORMAL LOW (ref 12.0–15.0)
Immature Granulocytes: 0 %
Lymphocytes Relative: 26 %
Lymphs Abs: 1.4 10*3/uL (ref 0.7–4.0)
MCH: 28 pg (ref 26.0–34.0)
MCHC: 31.7 g/dL (ref 30.0–36.0)
MCV: 88.2 fL (ref 80.0–100.0)
Monocytes Absolute: 0.5 10*3/uL (ref 0.1–1.0)
Monocytes Relative: 9 %
Neutro Abs: 3.3 10*3/uL (ref 1.7–7.7)
Neutrophils Relative %: 61 %
Platelets: 232 10*3/uL (ref 150–400)
RBC: 3.22 MIL/uL — ABNORMAL LOW (ref 3.87–5.11)
RDW: 17.2 % — ABNORMAL HIGH (ref 11.5–15.5)
WBC: 5.3 10*3/uL (ref 4.0–10.5)
nRBC: 0 % (ref 0.0–0.2)

## 2020-08-31 LAB — COMPREHENSIVE METABOLIC PANEL
ALT: 32 U/L (ref 0–44)
AST: 57 U/L — ABNORMAL HIGH (ref 15–41)
Albumin: 2.5 g/dL — ABNORMAL LOW (ref 3.5–5.0)
Alkaline Phosphatase: 93 U/L (ref 38–126)
Anion gap: 5 (ref 5–15)
BUN: 10 mg/dL (ref 8–23)
CO2: 25 mmol/L (ref 22–32)
Calcium: 8.5 mg/dL — ABNORMAL LOW (ref 8.9–10.3)
Chloride: 106 mmol/L (ref 98–111)
Creatinine, Ser: 1.15 mg/dL — ABNORMAL HIGH (ref 0.44–1.00)
GFR, Estimated: 51 mL/min — ABNORMAL LOW (ref >60)
Glucose, Bld: 94 mg/dL (ref 70–99)
Potassium: 4 mmol/L (ref 3.5–5.1)
Sodium: 136 mmol/L (ref 135–145)
Total Bilirubin: 1 mg/dL (ref 0.3–1.2)
Total Protein: 5.2 g/dL — ABNORMAL LOW (ref 6.5–8.1)

## 2020-08-31 LAB — MAGNESIUM: Magnesium: 2 mg/dL (ref 1.7–2.4)

## 2020-08-31 LAB — PHOSPHORUS: Phosphorus: 3 mg/dL (ref 2.5–4.6)

## 2020-08-31 MED ORDER — PANTOPRAZOLE SODIUM 40 MG PO TBEC
40.0000 mg | DELAYED_RELEASE_TABLET | Freq: Two times a day (BID) | ORAL | Status: DC
  Administered 2020-08-31 – 2020-09-01 (×3): 40 mg via ORAL
  Filled 2020-08-31 (×3): qty 1

## 2020-08-31 NOTE — TOC Progression Note (Signed)
Transition of Care [facility]) - Progression Note    Patient Details  Name: Carolyn Drake MRN: [id number] Date of Birth: July 18, 1950  Transition of Care [facility]) CM/SW Contact  Ross Ludwig, [place] Phone Number: 08/31/2020, 4:36 PM  Clinical Narrative:     CSW was informed that patient may be ready for discharge tomorrow.  CSW spoke to patient's daughter Seryna, [phone] and also spoke to [facility] SNF admissions worker Carmell Austria.  She will review patient's information and confirm they can accept patient.  CSW faxed updated clinicals to SNF at [phone].  CSW requested that she call back to confirm bed availability for tomorrow.  CSW awaiting for call back, CSW asked MD to order a new Covid test.         Expected Discharge Plan and Services                                                 Social Determinants of Health (SDOH) Interventions    Readmission Risk Interventions No flowsheet data found.

## 2020-08-31 NOTE — Progress Notes (Signed)
PROGRESS NOTE    Carolyn Drake  AOZ:[id number] DOB: 03/04/1951 DOA: 08/27/2020 PCP: Kendrick Ranch, MD   Brief Narrative:  HPI per Dr. Babs Bertin on 08/27/20 Carolyn Drake is a 70 y.o. female with medical history significant for hypertension, hyperlipidemia, GERD, stage IV lung cancer, collagenous colitis diagnosed in 2018, chronic iron deficiency anemia with baseline hemoglobin of 10, acquired hypothyroidism, who is admitted to [facility] for further evaluation and management of subacute lower gastrointestinal bleed by way of transfer from [facility].   The patient has been experiencing intermittent bright red blood per rectum over the course of the last 6 weeks.  In this context, she was seen by [facility] gastroenterology on 08/13/2020 for further evaluation of this rectal bleeding.  She conveys that approximately half of her bowel movements over the course of the last 6 months have been associated with bright red blood per rectum, but denies any associated melena.  Denies any significant recent worsening in the frequency of this hematochezia.  Denies any significant abdominal discomfort.  She reports chronic diarrhea, without significant change in frequency thereof.   Per evaluation by [facility] gastroenterology on 08/13/2020, they recommended pursuit of an outpatient colonoscopy.  The patient was in the process of getting scheduled for this endoscopic evaluation when she experienced a mechanical fall resulting in right hip fracture and hospitalization to the hospital service at [facility] on 08/25/2020.  Subsequently, orthopedic surgery was consulted, and she underwent ORIF of the right hip.No reported intraoperative complications. Postoperatively, she was started on Xarelto for thromboembolic prophylaxis, before subsequently developing evidence of a acute gastrointestinal bleed. Over the course of her hospitalization at [facility], her initial hemoglobin decreased  from 9.4-7.5, and fecal occult blood testing performed on 08/27/2020 was found to be positive. Xarelto was held at that time, and no additional blood thinning agents have been administered,including no aspirin. Additionally, the patient was started on IV Protonix. In the context of [place] [facility] providers,the patient was subsequently transferred to [facility] for further evaluation and management of her subacute lower gastrointestinal bleed. Of note, her most recent set of vital signs prior to transfer from [facility] were noted to be as follows: Blood pressure 109/53, heart rate 105, respiratory rate 16, and oxygen saturation 93% on room air. She was subsequently accepted for admission to the med telemetry floor at [facility].   Of note, it appears that the patient's baseline hemoglobin is approximately 10, with such value noted in February 2022.  She underwent colonoscopy in 2018, at which time biopsies diagnosed her with collagenous colitis in April 2018.  She also underwent EGD in August 2017 with associated gastric and duodenal biopsies revealing benign findings.  As an outpatient she is on daily baby aspirin.   She denies any associated chest pain, shortness of breath, dizziness, presyncope, or syncope.  Not associate with any recent subjective fever, chills, rigors, or generalized myalgias.  Aside from recent ground-level mechanical fall that resulted in her right hip fracture, she denies any recent or preceding trauma.  **Interim History GI was consulted for further evaluation and she was typed and screened and transfused 1 unit PRBCs.  GI recommended colonoscopy but patient has adamantly refused to have prep so now GI recommended nuclear medicine bleeding scan and if it is positive having IR be consulted for possible embolization.  Nuclear medicine bleeding scan was negative for any acute bleeding so she was advanced on her diet.  On  08/29/20  her hemoglobin dropped little bit more and was 7.2 this morning so she is typed and screened and transfused anothjer 1 unit PRBCs.  Prior to her getting a blood she spiked a temperature of 100.8 so blood cultures x2 are ordered and they hav.  We will need to monitor and will need PT OT to further evaluate and treat and they are recommending SNF; TOC has been consulted for further assistance with placement.   Assessment & Plan:   Principal Problem:   Acute lower GI bleeding Active Problems:   GERD (gastroesophageal reflux disease)   Acute on chronic blood loss anemia   HLD (hyperlipidemia)   HTN (hypertension)  Acute/Subacute GI Bleeding with bright red blood per rectum and rectal Bleeding and Anemia Hx of Collagenous Colitis  Hx of GIST Tumor -The patient has been experiencing intermittent bright red blood per rectum over the course of the last 6 weeks, following with outpatient gastroenterology over that time, with ensuing recommendation for outpatient colonoscopy, which was in the process of being scheduled with the patient experienced a mechanical fall leading to right hip fracture and ensuing hospitalization at [facility], -She has continued to experience intermittent episodes of bright red blood per rectum and absence of any melena, without significant increase in the frequency of such episodes.   -This appears to be associated with acute on chronic iron deficiency anemia, with presenting hemoglobin of 6.8 relative to baseline hemoglobin of 10.1 in February 2022, and similar to most recent prior value at [facility] prior to transfer, which was noted to be 7.5.   -Given the duration over which the patient has been experiencing bright red blood per rectum, suspect a very slow versus intermittent lower gastrointestinal bleed.  -She appears hemodynamically stable and otherwise asymptomatic.  -She is FOBT positive [facility] and was recently using Xarelto which has now been  stopped -Hemoglobin/hematocrit was 6.8/22.3 -> 6.4/21.3 has had a history of intermittent rectal bleeding for the last year or so and repeat was 6.5/21.3  -She was typed and screened and transfused 2 units of PRBCs repeat hemoglobin/hematocrit further improved and is stable at 9.0/28.4 -Anemia panel done and showed an iron level of 13, U IBC of 219, TIBC of 232, saturation ratios of 6%, ferritin level 48, folate of 9.9 -INR was 1.2 -Currently holding her anticoagulation prophylaxis for her surgical intervention and also holding aspirin 81 mg p.o. daily but maybe able to resume soon -On IV PPI q12h but will chan -Patient has declined colonoscopy due to issues with prep and GI as discussed about getting multiple options of getting colonoscopy prep which is declined -GI Has now recommending a bleeding scan for evaluation of rectal bleeding and anemia and if it is positive recommending interventional consult for consideration of embolization but this was NEGATIVE -Continue to monitor and trend hemoglobin and hematocrit -SINCE GI bleeding scan is negative, The Inpatient GI Team was recommending her follow-up with her primary gastroenterologist as an outpatient for further work-up for chronic anemia and GI bleed -Continue monitor for further signs or symptoms of bleeding and currently not having any  Acute on chronic iron deficiency anemia -She has a documented history of chronic iron deficiency anemia with a baseline hemoglobin being 10 as recent of February 2022 and she remained on a daily oral iron supplement -HemoGlobin had been trending down slowly and she has had intermittent bright red blood per rectum for last 6 weeks -Above but she is typed and screened and transfused 2 unit  of PRBCs total and will continue to transfuse less than 7 -Repeat INR yesterday was 1.2 -We will continue monitor on telemetry -We will refrain from any pharmacological anticoagulation for now and continue with SCDs -GI  was following and have offered the patient a colonoscopy but she has refused -Will change IV PPI from q12h to po BID and D/c with that.   Essential hypertension -She is on metoprolol as an outpatient and in the setting of her subacute GI bleed we will hold antihypertensives for now -Continue monitor blood pressures per protocol -Last blood pressure reading was 135/75  Hyperlipidemia -She is on a high intensity atorvastatin as an outpatient -Statin was held on admission but if she tolerates a diet will resume; diet advancement per GI -She takes atorvastatin 40 mg p.o. daily and will resume once able  GERD -Takes pantoprazole as an outpatient -Initially was switched to IV PPI twice daily and change back to po Today   Acquired Hypothyroidism -Resume Levothyroxine 75 mcg po Daily   AKI -In the setting of GI bleeding -Patient's BUNs/creatinine went from 12/1.65 -> 16/1.36 -> 16/1.27 -> 16/1.08 -> 12/0.99 -> 10/1.15 -Received 2 unit PRBCs and was getting lactated Ringer's at 50 MLS per hour but will stop now -Avoid Nephrotoxic medications, contrast dyes, hypotension renally dose medications -Repeat CMP in a.m.  Stage IV Lung Cancer  COPD -Wearing 2 Liters of O2 -Has Supplemental O2 and wears it when needed -SpO2: 100 % O2 Flow Rate (L/min): 2 L/min -Currently not wheezing or in Exacerbation  Hypokalemia -Patient's potassium this morning 4.0 -Replete with p.o. potassium chloride 40 mg twice daily x2 doses yesterday -Mag level 2.0 today -Repeat CMP in a.m.  Elevated AST -Mild and Likely reactive -Patient's AST went from 31 -> 57 -> 57 again today -Continue to Monitor and Trend -Repeat CMP in the AM   Right Hip Fx -Recent ORIF at OSH -Was started on Xarelto for thromboembolic prophylaxis but now held due to GI Bleeding and will need to start DVT prophylaxis and will give her Xarelto 30 mg subcu daily now and avoid Oral Agents -Will need PT/OT to evaluate and Treat when safe  from a GI standpoint and the prior orthopedic surgery notes from outside hospital recommending weightbearing as tolerated -Continues to have hip pain and will add pain regimen with p.o. tramadol and p.o. oxycodone in addition to her acetaminophen  Fever, improved and was isolated  -Unclear etiology -Spiked a temperature of 100.8 the day before yesterday -Obtain blood cultures x2 (NG2D) -Continue to monitor for signs and symptoms of infection; currently no overt infection noted -Repeat CBC in the a.m. and continue monitor and trend temperature curve  Underweight -Nutritionist consulted and will make diet recommendations now that diet is advanced  DVT prophylaxis: SCDs Code Status: FULL CODE Family Communication: Discussed with daughter at bedside Disposition Plan: Pending GI clearance and evaluation by PT and OT; Will need SNF at D/C   Status is: Inpatient  Remains inpatient appropriate because:Unsafe d/c plan, IV treatments appropriate due to intensity of illness or inability to take PO and Inpatient level of care appropriate due to severity of illness   Dispo: The patient is from: Home              Anticipated d/c is to: SNF              Patient currently is not medically stable to d/c.   Difficult to place patient No  Consultants:   Gastroenterology  Procedures: NM Bleeding Scan   Antimicrobials:  Anti-infectives (From admission, onward)   None        Subjective: Seen and examined at bedside and she is sitting in the chair at bedside and states that she is feeling better.  Denies any chest pain, lightheadedness or dizziness.  No nausea or vomiting.  No signs or symptoms of bleeding currently.  States that her hip pain is getting a little bit better.  No other concerns or complaints at this time.  Objective: Vitals:   08/30/20 2042 08/31/20 0350 08/31/20 0352 08/31/20 1235  BP: 140/77 (!) 154/87  135/75  Pulse: 77 73  83  Resp: 16 20  17   Temp: 99.3 F (37.4 C)  98.2 F (36.8 C)  98.1 F (36.7 C)  TempSrc:    Oral  SpO2: 94% 93%  100%  Weight:   46.8 kg   Height:        Intake/Output Summary (Last 24 hours) at 08/31/2020 1307 Last data filed at 08/31/2020 0200 Gross per 24 hour  Intake 369.87 ml  Output --  Net 369.87 ml   Filed Weights   08/29/20 0500 08/30/20 0523 08/31/20 0352  Weight: 55.6 kg 45.8 kg 46.8 kg   Examination: Physical Exam:  Constitutional: Thin chronically ill-appearing elderly Caucasian female in NAD appears calm and comfortable sitting in the Chair Eyes: Lids and conjunctivae normal, sclerae anicteric  ENMT: External Ears, Nose appear normal. Grossly normal hearing. Neck: Appears normal, supple, no cervical masses, normal ROM, no appreciable thyromegaly; no JVD Respiratory: Diminished to auscultation bilaterally with coarse breath sounds, no wheezing, rales, rhonchi or crackles. Normal respiratory effort and patient is not tachypenic. No accessory muscle use. Not wearing Supplemental O2 via [place] this AM Cardiovascular: RRR, no murmurs / rubs / gallops. S1 and S2 auscultated. No appreciable LE edema Abdomen: Soft, non-tender, non-distended. Bowel sounds positive.  GU: Deferred. Musculoskeletal: No clubbing / cyanosis of digits/nails. No joint deformity upper and lower extremities.  Skin: No rashes, lesions, ulcers on a limited skin evaluation. No induration; Warm and dry.  Neurologic: CN 2-12 grossly intact with no focal deficits. Romberg sign and cerebellar reflexes not assessed.  Psychiatric: Normal judgment and insight. Alert and oriented x 3. Normal mood and appropriate affect.   Data Reviewed: I have personally reviewed following labs and imaging studies  CBC: Recent Labs  Lab 08/28/20 1729 08/29/20 0449 08/29/20 2110 08/30/20 0701 08/31/20 0515  WBC 6.1 5.8 5.5 5.4 5.3  NEUTROABS 4.1 3.4 3.4 3.2 3.3  HGB 7.9* 7.3* 8.9* 9.1* 9.0*  HCT 24.7* 23.1* 28.1* 29.2* 28.4*  MCV 91.1 90.2 90.1 88.8 88.2  PLT 182  182 190 214 716   Basic Metabolic Panel: Recent Labs  Lab 08/27/20 2034 08/28/20 0448 08/29/20 0449 08/30/20 0701 08/31/20 0515  NA 139 140 137 142 136  K 3.9 3.7 3.9 3.0* 4.0  CL 105 108 104 107 106  CO2 27 25 22 25 25   GLUCOSE 102* 94 84 96 94  BUN 16 16 16 12 10   CREATININE 1.36* 1.27* 1.08* 0.99 1.15*  CALCIUM 8.1* 8.1* 8.2* 8.6* 8.5*  MG 1.8 1.7 1.7 1.7 2.0  PHOS  --   --  3.0 3.0 3.0   GFR: Estimated Creatinine Clearance: 33.2 mL/min (A) (by C-G formula based on SCr of 1.15 mg/dL (H)). Liver Function Tests: Recent Labs  Lab 08/27/20 2034 08/29/20 0449 08/30/20 0701 08/31/20 0515  AST 28 31 57* 57*  ALT 12 13 29  32  ALKPHOS 82 76 82 93  BILITOT 0.6 0.8 0.8 1.0  PROT 5.1* 4.9* 5.3* 5.2*  ALBUMIN 2.7* 2.3* 2.5* 2.5*   No results for input(s): LIPASE, AMYLASE in the last 168 hours. No results for input(s): AMMONIA in the last 168 hours. Coagulation Profile: Recent Labs  Lab 08/27/20 2034 08/28/20 0448  INR 1.4* 1.2   Cardiac Enzymes: No results for input(s): CKTOTAL, CKMB, CKMBINDEX, TROPONINI in the last 168 hours. BNP (last 3 results) No results for input(s): PROBNP in the last 8760 hours. HbA1C: No results for input(s): HGBA1C in the last 72 hours. CBG: No results for input(s): GLUCAP in the last 168 hours. Lipid Profile: No results for input(s): CHOL, HDL, LDLCALC, TRIG, CHOLHDL, LDLDIRECT in the last 72 hours. Thyroid Function Tests: No results for input(s): TSH, T4TOTAL, FREET4, T3FREE, THYROIDAB in the last 72 hours. Anemia Panel: No results for input(s): VITAMINB12, FOLATE, FERRITIN, TIBC, IRON, RETICCTPCT in the last 72 hours. Sepsis Labs: No results for input(s): PROCALCITON, LATICACIDVEN in the last 168 hours.  Recent Results (from the past 240 hour(s))  Culture, blood (routine x 2)     Status: None (Preliminary result)   Collection Time: 08/29/20  2:31 PM   Specimen: BLOOD  Result Value Ref Range Status   Specimen Description   Final     BLOOD RIGHT ANTECUBITAL Performed at [facility] [address], [place], [place] [address]    Special Requests   Final    BOTTLES DRAWN AEROBIC ONLY Blood Culture adequate volume Performed at [facility] [address], [place], [place] [address]    Culture   Final    NO GROWTH 2 DAYS Performed at [facility] [address] [place], [place] [address]    Report Status PENDING  Incomplete  Culture, blood (routine x 2)     Status: None (Preliminary result)   Collection Time: 08/29/20  2:36 PM   Specimen: BLOOD  Result Value Ref Range Status   Specimen Description   Final    BLOOD BLOOD RIGHT FOREARM Performed at [facility] [address] [place], [place] [address]    Special Requests   Final    BOTTLES DRAWN AEROBIC AND ANAEROBIC Blood Culture adequate volume Performed at [facility] [address] [place], [place] [address]    Culture   Final    NO GROWTH 2 DAYS Performed at [facility] [address], [place], [place] [address]    Report Status PENDING  Incomplete     RN Pressure Injury Documentation:     Estimated body mass index is 18.28 kg/m as calculated from the following:   Height as of this encounter: 5\' 3"  (1.6 m).   Weight as of this encounter: 46.8 kg.  Malnutrition Type: Nutrition Problem: Inadequate oral intake Etiology: inability to eat Malnutrition Characteristics: Signs/Symptoms: NPO status Nutrition Interventions: Interventions: Refer to RD note for recommendations  Radiology Studies: No results found.  Scheduled Meds: . sodium chloride   Intravenous Once  . atorvastatin  40 mg Oral Daily  . levothyroxine  75 mcg Oral QAC breakfast  . pantoprazole  40 mg Oral BID   Continuous Infusions:   LOS: 4 days   Kerney Elbe, DO [facility] PAGER is on AMION  If 7PM-7AM, please contact night-coverage [email or web address]

## 2020-08-31 NOTE — NC FL2 (Signed)
[place] LEVEL OF CARE SCREENING TOOL     IDENTIFICATION  Patient Name: Carolyn Drake Birthdate: 10/16/1950 Sex: female Admission Date (Current Location): 08/27/2020  [facility] and [place] Number:  Herbalist and Address:  [facility],  [place], [place]      Provider Number: [id number]  Attending Physician Name and Address:  Kerney Elbe, DO  Relative Name and Phone Number:  ANECIA, NUSBAUM [phone]  [phone]  [name],Saraia Daughter [phone]    Current Level of Care: Hospital Recommended Level of Care: [place] Prior Approval Number:    Date Approved/Denied:   PASRR Number:    Discharge Plan: SNF    Current Diagnoses: Patient Active Problem List   Diagnosis Date Noted  . Acute on chronic blood loss anemia 08/28/2020  . HLD (hyperlipidemia)   . HTN (hypertension)   . Acute lower GI bleeding 08/27/2020  . Abnormal PET scan of colon 08/13/2020  . Nausea without vomiting 06/24/2019  . Elevated LFTs 04/25/2019  . Rectal bleeding 04/22/2019  . Diarrhea 04/22/2019  . GERD (gastroesophageal reflux disease) 04/22/2019  . Abdominal pain 04/22/2019    Orientation RESPIRATION BLADDER Height & Weight     Self,Time,Situation,Place  Normal Continent Weight: 103 lb 2.8 oz (46.8 kg) Height:  5\' 3"  (160 cm)  BEHAVIORAL SYMPTOMS/MOOD NEUROLOGICAL BOWEL NUTRITION STATUS      Continent Diet  AMBULATORY STATUS COMMUNICATION OF NEEDS Skin   Limited Assist Verbally Surgical wounds                       Personal Care Assistance Level of Assistance  Bathing,Feeding,Dressing Bathing Assistance: Limited assistance Feeding assistance: Independent Dressing Assistance: Limited assistance     Functional Limitations Info  Sight,Speech,Hearing Sight Info: Adequate Hearing Info: Adequate Speech Info: Adequate    SPECIAL CARE FACTORS FREQUENCY  PT (By licensed PT),OT (By licensed OT)      PT Frequency: Minimum 5x a week OT Frequency: Minimum 5x a week            Contractures Contractures Info: Not present    Additional Factors Info  Code Status,Allergies Code Status Info: Full Code Allergies Info: Lisinopril           Current Medications (08/31/2020):  This is the current hospital active medication list Current Facility-Administered Medications  Medication Dose Route Frequency Provider Last Rate Last Admin  . 0.9 %  sodium chloride infusion (Manually program via Guardrails IV Fluids)   Intravenous Once [place], Omair Latif, DO      . acetaminophen (TYLENOL) tablet 650 mg  650 mg Oral Q6H PRN Howerter, Justin B, DO       Or  . acetaminophen (TYLENOL) suppository 650 mg  650 mg Rectal Q6H PRN Howerter, Justin B, DO      . albuterol (VENTOLIN HFA) 108 (90 Base) MCG/ACT inhaler 2 puff  2 puff Inhalation Q6H PRN Howerter, Justin B, DO      . atorvastatin (LIPITOR) tablet 40 mg  40 mg Oral Daily Raiford Noble [place], DO   40 mg at 08/30/20 1937  . levothyroxine (SYNTHROID) tablet 75 mcg  75 mcg Oral QAC breakfast Raiford Noble [place], DO   75 mcg at 08/31/20 9024  . ondansetron (ZOFRAN) injection 4 mg  4 mg Intravenous Q6H PRN Raiford Noble [place], DO   4 mg at 08/30/20 2232  . oxyCODONE (Oxy IR/ROXICODONE) immediate release tablet 5 mg  5 mg Oral Q6H  PRN Raiford Noble [place], DO   5 mg at 08/30/20 1836  . pantoprazole (PROTONIX) EC tablet 40 mg  40 mg Oral BID Sheikh, Georgina Quint Latif, DO      . traMADol Veatrice [place]) tablet 50 mg  50 mg Oral Q6H PRN [name], DO   50 mg at 08/30/20 2230     Discharge Medications: Please see discharge summary for a list of discharge medications.  Relevant Imaging Results:  Relevant Lab Results:   Additional Information SSN [id number]  Ross Ludwig, LCSW

## 2020-08-31 NOTE — Progress Notes (Signed)
Physical Therapy Treatment Patient Details Name: Carolyn Drake MRN: [id number] DOB: [birth year] Today's Date: 08/31/2020    History of Present Illness 70 yo female admitted to [facility] by way of transfer from [facility] with acute GI bleed. S/P R hip ORIF at [facility] on 08/25/20 after sustaining a fall. Hx of stage IV lung Ca, COPD, MI, anemia, hypothyroidism, anemia, colitis    PT Comments    POD # 6 Assisted OOB to bathroom then amb in hallway.   Pt plans to D/C to SNF for ST Rehab.   Follow Up Recommendations  SNF     Equipment Recommendations  Rolling walker with 5" wheels    Recommendations for Other Services       Precautions / Restrictions Precautions Precautions: Fall Precaution Comments: Reports multiple falls at home. Restrictions Weight Bearing Restrictions: No RLE Weight Bearing: Weight bearing as tolerated Other Position/Activity Restrictions: WBAT per Dr Alfredia Ferguson    Mobility  Bed Mobility Overal bed mobility: Needs Assistance Bed Mobility: Supine to Sit;Sit to Supine     Supine to sit: Supervision Sit to supine: Min assist   General bed mobility comments: self able with slightly increased assist back to bed due to pain after amb    Transfers Overall transfer level: Needs assistance Equipment used: Rolling walker (2 wheeled) Transfers: Sit to/from [facility] Sit to Stand: Min assist Stand pivot transfers: Min assist       General transfer comment: Min A from low toilet, Min guard A from recliner. Cues for safety, technique, hand plaement. Increased time.  Ambulation/Gait Ambulation/Gait assistance: Min assist Gait Distance (Feet): 65 Feet Assistive device: Rolling walker (2 wheeled) Gait Pattern/deviations: Step-to pattern;Trunk flexed Gait velocity: decreased   General Gait Details: decreased amb distance due to increased c/o R hip pain.  Slow but steady gait with use of walker.   Stairs             Wheelchair  Mobility    Modified Rankin (Stroke Patients Only)       Balance                                            Cognition Arousal/Alertness: Awake/alert Behavior During Therapy: WFL for tasks assessed/performed Overall Cognitive Status: Within Functional Limits for tasks assessed                                 General Comments: AxO x 3 very pleasant      Exercises      General Comments        Pertinent Vitals/Pain Pain Assessment: 0-10 Pain Score: 6  Pain Location: R hip Pain Descriptors / Indicators: Grimacing;Guarding;Discomfort;Sore Pain Intervention(s): Monitored during session;Repositioned;Patient requesting pain meds-RN notified    Home Living                      Prior Function            PT Goals (current goals can now be found in the care plan section) Progress towards PT goals: Progressing toward goals    Frequency    Min 3X/week      PT Plan Current plan remains appropriate    Co-evaluation              AM-PAC PT "6 Clicks" Mobility  Outcome Measure  Help needed turning from your back to your side while in a flat bed without using bedrails?: A Little Help needed moving from lying on your back to sitting on the side of a flat bed without using bedrails?: A Little Help needed moving to and from a bed to a chair (including a wheelchair)?: A Little Help needed standing up from a chair using your arms (e.g., wheelchair or bedside chair)?: A Little Help needed to walk in hospital room?: A Little Help needed climbing 3-5 steps with a railing? : A Lot 6 Click Score: 17    End of Session Equipment Utilized During Treatment: Gait belt Activity Tolerance: Patient tolerated treatment well Patient left: in bed;with call bell/phone within reach;with bed alarm set Nurse Communication: Patient requests pain meds PT Visit Diagnosis: Muscle weakness (generalized) (M62.81);History of falling (Z91.81);Pain      Time: 1546-1610 PT Time Calculation (min) (ACUTE ONLY): 24 min  Charges:  $Gait Training: 8-22 mins $Therapeutic Activity: 8-22 mins                     Rica Koyanagi  PTA Acute  Rehabilitation Services Pager      [phone] Office      [phone]

## 2020-09-01 DIAGNOSIS — N179 Acute kidney failure, unspecified: Secondary | ICD-10-CM

## 2020-09-01 LAB — COMPREHENSIVE METABOLIC PANEL
ALT: 33 U/L (ref 0–44)
AST: 51 U/L — ABNORMAL HIGH (ref 15–41)
Albumin: 2.6 g/dL — ABNORMAL LOW (ref 3.5–5.0)
Alkaline Phosphatase: 100 U/L (ref 38–126)
Anion gap: 7 (ref 5–15)
BUN: 10 mg/dL (ref 8–23)
CO2: 25 mmol/L (ref 22–32)
Calcium: 8.6 mg/dL — ABNORMAL LOW (ref 8.9–10.3)
Chloride: 103 mmol/L (ref 98–111)
Creatinine, Ser: 1.23 mg/dL — ABNORMAL HIGH (ref 0.44–1.00)
GFR, Estimated: 47 mL/min — ABNORMAL LOW (ref >60)
Glucose, Bld: 92 mg/dL (ref 70–99)
Potassium: 3.7 mmol/L (ref 3.5–5.1)
Sodium: 135 mmol/L (ref 135–145)
Total Bilirubin: 0.9 mg/dL (ref 0.3–1.2)
Total Protein: 5.2 g/dL — ABNORMAL LOW (ref 6.5–8.1)

## 2020-09-01 LAB — CBC WITH DIFFERENTIAL/PLATELET
Abs Immature Granulocytes: 0.02 10*3/uL (ref 0.00–0.07)
Basophils Absolute: 0 10*3/uL (ref 0.0–0.1)
Basophils Relative: 0 %
Eosinophils Absolute: 0.2 10*3/uL (ref 0.0–0.5)
Eosinophils Relative: 3 %
HCT: 28.8 % — ABNORMAL LOW (ref 36.0–46.0)
Hemoglobin: 8.9 g/dL — ABNORMAL LOW (ref 12.0–15.0)
Immature Granulocytes: 0 %
Lymphocytes Relative: 30 %
Lymphs Abs: 1.6 10*3/uL (ref 0.7–4.0)
MCH: 27.9 pg (ref 26.0–34.0)
MCHC: 30.9 g/dL (ref 30.0–36.0)
MCV: 90.3 fL (ref 80.0–100.0)
Monocytes Absolute: 0.5 10*3/uL (ref 0.1–1.0)
Monocytes Relative: 9 %
Neutro Abs: 3.1 10*3/uL (ref 1.7–7.7)
Neutrophils Relative %: 58 %
Platelets: 257 10*3/uL (ref 150–400)
RBC: 3.19 MIL/uL — ABNORMAL LOW (ref 3.87–5.11)
RDW: 16.4 % — ABNORMAL HIGH (ref 11.5–15.5)
WBC: 5.3 10*3/uL (ref 4.0–10.5)
nRBC: 0 % (ref 0.0–0.2)

## 2020-09-01 LAB — PHOSPHORUS: Phosphorus: 3.6 mg/dL (ref 2.5–4.6)

## 2020-09-01 LAB — MAGNESIUM: Magnesium: 1.8 mg/dL (ref 1.7–2.4)

## 2020-09-01 LAB — SARS CORONAVIRUS 2 (TAT 6-24 HRS): SARS Coronavirus 2: NEGATIVE

## 2020-09-01 MED ORDER — ACETAMINOPHEN 325 MG PO TABS: 650.0000 mg | ORAL_TABLET | Freq: Four times a day (QID) | ORAL | 0 refills | Status: DC | PRN

## 2020-09-01 MED ORDER — SODIUM CHLORIDE 0.9 % IV SOLN: INTRAVENOUS | Status: DC

## 2020-09-01 MED ORDER — ENOXAPARIN SODIUM 30 MG/0.3ML ~~LOC~~ SOLN: 30.0000 mg | SUBCUTANEOUS | 0 refills | Status: DC

## 2020-09-01 MED ORDER — OXYCODONE HCL 5 MG PO TABS: 5.0000 mg | ORAL_TABLET | Freq: Four times a day (QID) | ORAL | 0 refills | Status: DC | PRN

## 2020-09-01 MED ORDER — MAGNESIUM SULFATE 2 GM/50ML IV SOLN
2.0000 g | Freq: Once | INTRAVENOUS | Status: AC
  Administered 2020-09-01: 2 g via INTRAVENOUS
  Filled 2020-09-01: qty 50

## 2020-09-01 MED ORDER — CLONAZEPAM 0.5 MG PO TABS
0.5000 mg | ORAL_TABLET | Freq: Every day | ORAL | 0 refills | Status: AC | PRN
Start: 2020-09-01 — End: ?

## 2020-09-01 NOTE — Discharge Summary (Signed)
Physician Discharge Summary  Carolyn Drake XBJ:[id number] DOB: 1950-07-18 DOA: 08/27/2020  PCP: Kendrick Ranch, MD  Admit date: 72/21/2022 Discharge date: 72/26/2022  Admitted From: Hospital Transfer Disposition: SNF  Recommendations for Outpatient Follow-up:  1. Follow up with PCP in 1-2 weeks 2. Follow up with Gastroenterology within 1-2 weeks 3. Follow up with Orthopedic Surgery within 1-2 weeks 4. Please obtain BMP/CBC in one week 5. Please follow up on the following pending results:  Home Health: No Equipment/Devices: None  Discharge Condition: Stable  CODE STATUS: FULL CODE  Diet recommendation:   Brief/Interim Summary: HPI per Dr. Babs Bertin on 08/27/20 Carolyn Drake a 70 y.o.femalewith medical history significant forhypertension, hyperlipidemia, GERD, stage IV lung cancer, collagenous colitis diagnosed in 2018, chronic iron deficiency anemia with baseline hemoglobin of 10, acquired hypothyroidism,who is admitted to [facility] for further evaluation and management of subacute lower gastrointestinal bleed by way of transfer from [facility].  The patient has been experiencing intermittent bright red blood per rectum over the course of the last 6 weeks. In this context, she was seen by [facility] gastroenterology on 08/13/2020 for further evaluation of this rectal bleeding. She conveys that approximately half of her bowel movements over the course of the last 6 months have been associated with bright red blood per rectum, but denies any associated melena. Denies any significant recent worsening in the frequency of this hematochezia. Denies any significant abdominal discomfort. She reports chronic diarrhea, without significant change in frequency thereof.   Per evaluation by [facility] gastroenterology on 08/13/2020, they recommended pursuit of an outpatient colonoscopy. The patient was in the process of getting scheduled for this endoscopic  evaluation when she experienced a mechanical fall resulting in right hip fracture and hospitalization to the hospital service at [facility] on4/19/2022. Subsequently, orthopedic surgery was consulted, and she underwent ORIF of the right hip.No reported intraoperative complications. Postoperatively, she was started on Xarelto for thromboembolic prophylaxis, before subsequently developing evidence of a acute gastrointestinal bleed. Over the course of her hospitalization at [facility], her initial hemoglobin decreased from 9.4-7.5, and fecal occult blood testing performed on 08/27/2020 was found to be positive. Xarelto was held at that time, and no additional blood thinning agents have been administered,including no aspirin. Additionally, the patient was started on IV Protonix. In the context of [place] [facility]enterology providers,the patient was subsequently transferred to [facility] for further evaluation and management ofher subacute lower gastrointestinal bleed.Of note, her most recent set of vital signs prior to transfer from [facility] were noted to be as follows: Blood pressure 109/53, heart rate 105, respiratory rate 16, and oxygen saturation 93% on room air. She was subsequently accepted for admission to the med telemetry floor at [facility].   Of note, it appears that the patient's baseline hemoglobin is approximately 10, with such value noted in February 2022. She underwent colonoscopy in 2018, at which time biopsies diagnosed her with collagenous colitis in April 2018. She also underwent EGD in August 2017 with associated gastric and duodenal biopsies revealing benign findings. As an outpatient she is on daily baby aspirin.  She denies any associated chest pain, shortness of breath, dizziness, presyncope, or syncope. Not associate with any recent subjective fever, chills, rigors, or generalized myalgias. Aside from recent  ground-level mechanical fall that resulted in her right hip fracture, she denies any recent or preceding trauma.  **Interim History GI was consulted for further evaluation and she was typed and screened and transfused 1  unit PRBCs.  GI recommended colonoscopy but patient has adamantly refused to have prep so now GI recommended nuclear medicine bleeding scan and if it is positive having IR be consulted for possible embolization.  Nuclear medicine bleeding scan was negative for any acute bleeding so she was advanced on her diet.  On 08/29/20 her hemoglobin dropped little bit more and was 7.2 this morning so she is typed and screened and transfused anothjer 1 unit PRBCs.  Prior to her getting a blood she spiked a temperature of 100.8 so blood cultures x2 are ordered and they have been Negative.  We will need to monitor and will need PT OT to further evaluate and treat and they are recommending SNF; TOC has been consulted for further assistance with placement and bed is available today. Renal Fxn slightly went up so was started back on Fluids. Currently remains medically stable to D/C   Discharge Diagnoses:  Principal Problem:   Acute lower GI bleeding Active Problems:   GERD (gastroesophageal reflux disease)   Acute on chronic blood loss anemia   HLD (hyperlipidemia)   HTN (hypertension)  Acute/Subacute GI Bleeding with bright red blood per rectum and rectal Bleeding and Anemia Hx of Collagenous Colitis  Hx of GIST Tumor -The patient has been experiencing intermittent bright red blood per rectum over the course of the last 6 weeks, following with outpatient gastroenterology over that time, with ensuing recommendation for outpatient colonoscopy,which was in the process of being scheduled with the patient experienced a mechanical fall leading to right hip fracture and ensuing hospitalization at [facility], -She has continued to experience intermittent episodes of bright red blood per rectum and  absence of any melena,without significant increase in the frequency of such episodes.  -This appears to be associated with acute on chronic iron deficiency anemia, with presenting hemoglobin of 6.8relative to baseline hemoglobin of 10.1 in February 2022, and similar to most recent prior value at [facility] prior to transfer, which was noted to be 7.5.  -Given the duration over which the patient has been experiencing bright red blood per rectum, suspect a very slow versus intermittent lower gastrointestinal bleed.  -She appears hemodynamically stable and otherwise asymptomatic.  -She is FOBT positive [facility] and was recently using Xarelto which has now been stopped -Hemoglobin/hematocrit was 6.8/22.3 -> 6.4/21.3 has had a history of intermittent rectal bleeding for the last year or so and repeat was 6.5/21.3  -She was typed and screened and transfused 2 units of PRBCs repeat hemoglobin/hematocrit further improved and is stable at 9.0/28.4 yesterday and today it is 8.9/28.8 -Anemia panel done and showed an iron level of 13, U IBC of 219, TIBC of 232, saturation ratios of 6%, ferritin level 48, folate of 9.9 -INR was 1.2 -Currently holding her anticoagulation prophylaxis for her surgical intervention and also holding aspirin 81 mg p.o. daily but maybe able to resume soon -On IV PPI q12h but will change to po  -Patient has declined colonoscopy due to issues with prep and GI as discussed about getting multiple options of getting colonoscopy prep which is declined -GI Has now recommending a bleeding scan for evaluation of rectal bleeding and anemia and if it is positive recommending interventional consult for consideration of embolization but this was NEGATIVE -Continue to monitor and trend hemoglobin and hematocrit -SINCE GI bleeding scan is negative, The Inpatient GI Team was recommending her follow-up with her primary gastroenterologist as an outpatient for further work-up for chronic anemia  and GI bleed -  Continue monitor for further signs or symptoms of bleeding and currently not having any -Follow up with Primary Gastroenterologist as an outpatient   Acute on chronic iron deficiency anemia -She has a documented history of chronic iron deficiency anemia with a baseline hemoglobin being 10 as recent of February 2022 and she remained on a daily oral iron supplement -HemoGlobin had been trending down slowly and she has had intermittent bright red blood per rectum for last 6 weeks -Above but she is typed and screened and transfused 2 unit of PRBCs total and will continue to transfuse less than 7 -Repeat INR yesterday was 1.2 -We will continue monitor on telemetry -We will refrain from any pharmacological anticoagulation for now and continue with SCDs -GI was following and have offered the patient a colonoscopy but she has refused -Will change IV PPI from q12h to po BID and D/c with that.   Essential hypertension -She is on metoprolol as an outpatient and in the setting of her subacute GI bleed we will hold antihypertensives for now -Continue monitor blood pressures per protocol -Last blood pressure reading was 135/75  Hyperlipidemia -She is on a high intensity atorvastatin as an outpatient -Statin was held on admission but if she tolerates a diet will resume; diet advancement per GI -She takes atorvastatin 40 mg p.o. daily and will resume once able  GERD -Takes pantoprazole as an outpatient -Initially was switched to IV PPI twice daily and change back to po yesterday   Acquired Hypothyroidism -Resume Levothyroxine 75 mcg po Daily   AKI, mild  -In the setting of GI bleeding -Patient's BUNs/creatinine went from 12/1.65 -> 16/1.36 -> 16/1.27 -> 16/1.08 -> 12/0.99 -> 10/1.15 -> 10/1.23 -Received 2 unit PRBCs and was getting lactated Ringer's at 50 MLS per hour that had been stopped; resume IVF with NS at 75 ml/hr  -Avoid Nephrotoxic medications, contrast dyes,  hypotension renally dose medications -Repeat CMP in a.m.  Stage IV Lung Cancer  COPD -Wearing 2 Liters of O2 -Has Supplemental O2 and wears it when needed -SpO2: 96 % O2 Flow Rate (L/min): 2 L/min; C/w if necessary  -Currently not wheezing or in Exacerbation  Hypokalemia -Patient's potassium this morning 3.7 -Replete with p.o. potassium chloride 40 mg twice daily x2 doses yesterday -Mag level was 1.8 today and will be replete prior to D/C  -Repeat CMP in a.m.  Elevated AST -Mild and Likely reactive -Patient's AST went from 31 -> 57 -> 57 -> 51 -Continue to Monitor and Trend -Repeat CMP within 1-2 weeks    Right Hip Fx -Recent ORIF at OSH -Was started on Xarelto for thromboembolic prophylaxis but now held due to GI Bleeding and will need to start DVT prophylaxis and will give her Loxenox 30 mg subcu daily now and avoid Oral Agents per discussion with our in-house orthopedic surgeon Dr. Rod Can; C/w Lovenox at D/C -Will need PT/OT to evaluate and Treat when safe from a GI standpoint and the prior orthopedic surgery notes from outside hospital recommending weightbearing as tolerated -Continues to have hip pain and will add pain regimen with p.o. tramadol and p.o. oxycodone in addition to her acetaminophen -Follow up with Primary Orthopedic Surgeon as an outpatient   Fever, improved and was isolated  -Unclear etiology -Spiked a temperature of 100.8 the day before yesterday -Obtain blood cultures x2 (NG3D) -Continue to monitor for signs and symptoms of infection; currently no overt infection noted -Repeat CBC in the a.m. and continue monitor and trend temperature curve  Underweight -Nutritionist consulted and will make diet recommendations now that diet is advanced  Discharge Instructions  Discharge Instructions    Call MD for:  difficulty breathing, headache or visual disturbances   Complete by: As directed    Call MD for:  extreme fatigue   Complete by: As  directed    Call MD for:  hives   Complete by: As directed    Call MD for:  persistant dizziness or light-headedness   Complete by: As directed    Call MD for:  persistant nausea and vomiting   Complete by: As directed    Call MD for:  redness, tenderness, or signs of infection (pain, swelling, redness, odor or green/yellow discharge around incision site)   Complete by: As directed    Call MD for:  severe uncontrolled pain   Complete by: As directed    Call MD for:  temperature >100.4   Complete by: As directed    Diet - low sodium heart healthy   Complete by: As directed    Discharge instructions   Complete by: As directed    You were cared for by a hospitalist during your hospital stay. If you have any questions about your discharge medications or the care you received while you were in the hospital after you are discharged, you can call the unit and ask to speak with the hospitalist on call if the hospitalist that took care of you is not available. Once you are discharged, your primary care physician will handle any further medical issues. Please note that NO REFILLS for any discharge medications will be authorized once you are discharged, as it is imperative that you return to your primary care physician (or establish a relationship with a primary care physician if you do not have one) for your aftercare needs so that they can reassess your need for medications and monitor your lab values.  Follow up with PCP, Orthopedic Surgery, and Gastroenterology within 1-2 weeks. Take all medications as prescribed. If symptoms change or worsen please return to the ED for evaluation   Discharge wound care:   Complete by: As directed    Will need follow up with Ortho within 1-2 weeks   Increase activity slowly   Complete by: As directed      Allergies as of 09/01/2020      Reactions   Lisinopril Cough      Medication List    STOP taking these medications   bisacodyl 5 MG EC tablet Generic  drug: bisacodyl   hydrocortisone 2.5 % rectal cream Commonly known as: ANUSOL-HC   polyethylene glycol-electrolytes 420 g solution Commonly known as: NuLYTELY     TAKE these medications   acetaminophen 325 MG tablet Commonly known as: TYLENOL Take 2 tablets (650 mg total) by mouth every 6 (six) hours as needed for mild pain (or Fever >/= 101).   albuterol 108 (90 Base) MCG/ACT inhaler Commonly known as: VENTOLIN HFA Inhale 2 puffs into the lungs every 6 (six) hours as needed for shortness of breath or wheezing.   aspirin 81 MG EC tablet Take 81 mg by mouth daily.   atorvastatin 40 MG tablet Commonly known as: LIPITOR Take 40 mg by mouth daily.   clonazePAM 0.5 MG tablet Commonly known as: KLONOPIN Take 1 tablet (0.5 mg total) by mouth daily as needed for anxiety.   diphenhydrAMINE 25 mg capsule Commonly known as: BENADRYL Take 25 mg by mouth every 6 (six) hours as needed for allergies.  diphenoxylate-atropine 2.5-0.025 MG tablet Commonly known as: LOMOTIL Take 1 tablet by mouth every 6 (six) hours as needed for diarrhea or loose stools.   enoxaparin 30 MG/0.3ML injection Commonly known as: Lovenox Inject 0.3 mLs (30 mg total) into the skin daily.   escitalopram 20 MG tablet Commonly known as: LEXAPRO Take 20 mg by mouth daily.   Ferrous Fumarate 324 (106 Fe) MG Tabs tablet Commonly known as: HEMOCYTE - 106 mg FE Take 1 tablet by mouth 2 (two) times daily.   levothyroxine 75 MCG tablet Commonly known as: SYNTHROID Take 75 mcg by mouth daily before breakfast.   Melatonin 10 MG Tabs Take 10 mg by mouth at bedtime.   metoprolol tartrate 50 MG tablet Commonly known as: LOPRESSOR Take 50 mg by mouth daily.   nitroGLYCERIN 0.4 MG SL tablet Commonly known as: NITROSTAT Place 0.4 mg under the tongue every 5 (five) minutes as needed for chest pain.   oxyCODONE 5 MG immediate release tablet Commonly known as: Oxy IR/ROXICODONE Take 1 tablet (5 mg total) by  mouth every 6 (six) hours as needed for severe pain.   pantoprazole 40 MG tablet Commonly known as: PROTONIX Take 40 mg by mouth 2 (two) times daily.   polyethylene glycol 17 g packet Commonly known as: MIRALAX / GLYCOLAX Take 17 g by mouth daily as needed for mild constipation.   promethazine 25 MG tablet Commonly known as: PHENERGAN Take 25 mg by mouth every 6 (six) hours as needed for vomiting or nausea.   rizatriptan 10 MG disintegrating tablet Commonly known as: MAXALT-MLT Take 10 mg by mouth daily as needed for migraine.   traZODone 50 MG tablet Commonly known as: DESYREL Take 50 mg by mouth at bedtime.            Discharge Care Instructions  (From admission, onward)         Start     Ordered   09/01/20 0000  Discharge wound care:       Comments: Will need follow up with Ortho within 1-2 weeks   09/01/20 1124          Allergies  Allergen Reactions  . Lisinopril Cough    Consultations:  Gastroenterology  Discussed case with Orthopedic Surgery Dr. Lyla Glassing  Procedures/Studies: NM GI Blood Loss  Result Date: 08/28/2020 CLINICAL DATA:  70 year old with GI bleed.  Left abdominal pain. EXAM: NUCLEAR MEDICINE GASTROINTESTINAL BLEEDING SCAN TECHNIQUE: Sequential abdominal images were obtained following intravenous administration of Tc-87m labeled red blood cells. RADIOPHARMACEUTICALS:  24.3 mCi Tc-14m pertechnetate in-vitro labeled red cells. COMPARISON:  Abdominal ultrasound 05/27/2019 FINDINGS: Expected uptake in the vascular structures. No uptake within the GI tract. IMPRESSION: Negative for active GI bleeding. Electronically Signed   By: Markus Daft M.D.   On: 08/28/2020 16:40    Subjective: Seen and examined at bedside and she was doing fairly well sitting up at the edge of the bed eating breakfast.  Still complain about some pain.  No nausea or vomiting.  Does not feel short of breath.  No other concerns or complaints at this time and ready to go back to  skilled nursing facility.   Discharge Exam: Vitals:   08/31/20 2105 09/01/20 0501  BP: 131/77 (!) 149/83  Pulse: 78 73  Resp: 16 12  Temp: 99.4 F (37.4 C) 98.5 F (36.9 C)  SpO2: 98% 96%   Vitals:   08/31/20 1235 08/31/20 2105 09/01/20 0500 09/01/20 0501  BP: 135/75 131/77  (!) 149/83  Pulse: 83 78  73  Resp: 17 16  12   Temp: 98.1 F (36.7 C) 99.4 F (37.4 C)  98.5 F (36.9 C)  TempSrc: Oral Oral  Oral  SpO2: 100% 98%  96%  Weight:   44.5 kg   Height:       General: Pt is alert, awake, not in acute distress Cardiovascular: RRR, S1/S2 +, no rubs, no gallops Respiratory: CTA bilaterally, no wheezing, no rhonchi Abdominal: Soft, NT, ND, bowel sounds + Extremities: no edema, no cyanosis  The results of significant diagnostics from this hospitalization (including imaging, microbiology, ancillary and laboratory) are listed below for reference.    Microbiology: Recent Results (from the past 240 hour(s))  Culture, blood (routine x 2)     Status: None (Preliminary result)   Collection Time: 08/29/20  2:31 PM   Specimen: BLOOD  Result Value Ref Range Status   Specimen Description   Final    BLOOD RIGHT ANTECUBITAL Performed at [facility] [address] [place], [place] [address]    Special Requests   Final    BOTTLES DRAWN AEROBIC ONLY Blood Culture adequate volume Performed at [facility] [address], [place], [place] [address]    Culture   Final    NO GROWTH 3 DAYS Performed at [facility], [place] [address] [place], [place] [address]    Report Status PENDING  Incomplete  Culture, blood (routine x 2)     Status: None (Preliminary result)   Collection Time: 08/29/20  2:36 PM   Specimen: BLOOD  Result Value Ref Range Status   Specimen Description   Final    BLOOD BLOOD RIGHT FOREARM Performed at [facility] [address] [place], [place] [address]    Special Requests   Final    BOTTLES  DRAWN AEROBIC AND ANAEROBIC Blood Culture adequate volume Performed at [facility] [address], [place],  [address]    Culture   Final    NO GROWTH 3 DAYS Performed at [facility], [place] [address] [place],  [address]    Report Status PENDING  Incomplete    Labs: BNP (last 3 results) No results for input(s): BNP in the last 8760 hours. Basic Metabolic Panel: Recent Labs  Lab 08/28/20 0448 08/29/20 0449 08/30/20 0701 08/31/20 0515 09/01/20 0504  NA 140 137 142 136 135  K 3.7 3.9 3.0* 4.0 3.7  CL 108 104 107 106 103  CO2 25 22 25 25 25   GLUCOSE 94 84 96 94 92  BUN 16 16 12 10 10   CREATININE 1.27* 1.08* 0.99 1.15* 1.23*  CALCIUM 8.1* 8.2* 8.6* 8.5* 8.6*  MG 1.7 1.7 1.7 2.0 1.8  PHOS  --  3.0 3.0 3.0 3.6   Liver Function Tests: Recent Labs  Lab 08/27/20 2034 08/29/20 0449 08/30/20 0701 08/31/20 0515 09/01/20 0504  AST 28 31 57* 57* 51*  ALT 12 13 29  32 33  ALKPHOS 82 76 82 93 100  BILITOT 0.6 0.8 0.8 1.0 0.9  PROT 5.1* 4.9* 5.3* 5.2* 5.2*  ALBUMIN 2.7* 2.3* 2.5* 2.5* 2.6*   No results for input(s): LIPASE, AMYLASE in the last 168 hours. No results for input(s): AMMONIA in the last 168 hours. CBC: Recent Labs  Lab 08/29/20 0449 08/29/20 2110 08/30/20 0701 08/31/20 0515 09/01/20 0504  WBC 5.8 5.5 5.4 5.3 5.3  NEUTROABS 3.4 3.4 3.2 3.3 3.1  HGB 7.3* 8.9* 9.1* 9.0* 8.9*  HCT 23.1* 28.1* 29.2* 28.4* 28.8*  MCV 90.2 90.1 88.8 88.2 90.3  PLT 182 190 214 232 257   Cardiac Enzymes: No results for input(s): CKTOTAL, CKMB, CKMBINDEX, TROPONINI in the last 168 hours. BNP: Invalid input(s): POCBNP CBG: No results for input(s): GLUCAP in the last 168 hours. D-Dimer No results for input(s): DDIMER in the last 72 hours. Hgb A1c No results for input(s): HGBA1C in the last 72 hours. Lipid Profile No results for input(s): CHOL, HDL, LDLCALC, TRIG, CHOLHDL, LDLDIRECT in the last 72 hours. Thyroid function studies No  results for input(s): TSH, T4TOTAL, T3FREE, THYROIDAB in the last 72 hours.  Invalid input(s): FREET3 Anemia work up No results for input(s): VITAMINB12, FOLATE, FERRITIN, TIBC, IRON, RETICCTPCT in the last 72 hours. Urinalysis No results found for: COLORURINE, APPEARANCEUR, [place], [place], [place], [place], [place], [place], PROTEINUR, UROBILINOGEN, NITRITE, LEUKOCYTESUR Sepsis Labs Invalid input(s): PROCALCITONIN,  WBC,  LACTICIDVEN Microbiology Recent Results (from the past 240 hour(s))  Culture, blood (routine x 2)     Status: None (Preliminary result)   Collection Time: 08/29/20  2:31 PM   Specimen: BLOOD  Result Value Ref Range Status   Specimen Description   Final    BLOOD RIGHT ANTECUBITAL Performed at [facility] [address] [place], [place] [address]    Special Requests   Final    BOTTLES DRAWN AEROBIC ONLY Blood Culture adequate volume Performed at [facility] [address], [employer] [employer] [place], [place] [address]    Culture   Final    NO GROWTH 3 DAYS Performed at [facility], [place] [address], [place], [place] [address]    Report Status PENDING  Incomplete  Culture, blood (routine x 2)     Status: None (Preliminary result)   Collection Time: 08/29/20  2:36 PM   Specimen: BLOOD  Result Value Ref Range Status   Specimen Description   Final    BLOOD BLOOD RIGHT FOREARM Performed at [facility] [address], [place], [place] [address]    Special Requests   Final    BOTTLES DRAWN AEROBIC AND ANAEROBIC Blood Culture adequate volume Performed at [facility] [address], [place], [place] [address]    Culture   Final    NO GROWTH 3 DAYS Performed at [facility], [place] [address] [place],  [address]    Report Status PENDING  Incomplete   Time coordinating discharge: 35 minutes  SIGNED:  Kerney Elbe, DO [facility] 09/01/2020, 11:45 AM Pager is  on [place]  If 7PM-7AM, please contact night-coverage [email or web address]

## 2020-09-01 NOTE — Progress Notes (Signed)
Patient transported to [facility] via ems. VSS on discharge. Report given to [facility] personnel. EMS has AVS. No additional questions endorsed at this time. I attempted to call report twice to the facility with no answer and voicemail left.

## 2020-09-01 NOTE — Care Management Important Message (Signed)
Important Message  Patient Details IM Letter given to the Patient. Name: Carolyn Drake MRN: [id number] Date of Birth: [birth year]   Medicare Important Message Given:  Yes     [name], [name] 09/01/2020, 9:23 AM

## 2020-09-01 NOTE — TOC Progression Note (Signed)
Transition of Care [facility]) - Progression Note    Patient Details  Name: Carolyn Drake MRN: [id number] Date of Birth: July 04, 1950  Transition of Care [facility]) CM/SW Contact  Ross Ludwig, [place] Phone Number: 09/01/2020, 10:33 AM  Clinical Narrative:     CSW spoke to [facility], they can accept patient today pending covid negative Covid test.  CSW continuing to follow.        Expected Discharge Plan and Services                                                 Social Determinants of Health (SDOH) Interventions    Readmission Risk Interventions No flowsheet data found.

## 2020-09-01 NOTE — Progress Notes (Signed)
Occupational Therapy Treatment Patient Details Name: Carolyn Drake MRN: [id number] DOB: [birth year] Today's Date: 09/01/2020    History of present illness 70 yo female admitted to [facility] by way of transfer from [facility] with acute GI bleed. S/P R hip ORIF at [facility] on 08/25/20 after sustaining a fall. Hx of stage IV lung Ca, COPD, MI, anemia, hypothyroidism, anemia, colitis   OT comments  Patient very pleasant and agreeable to OT despite report of pain in R groin. Patient overall min G for functional ambulation to sink, then bathroom to utilize toilet. Patient needs consistent cuing for safe hand placement during sit <> stand not to pull on walker. At end of session patient transfer to recliner, RN notified patient requesting pain medication. Patient progressing well, continue with POC.   Follow Up Recommendations  SNF    Equipment Recommendations  Other (comment) (defer to next venue)       Precautions / Restrictions Precautions Precautions: Fall Precaution Comments: Reports multiple falls at home. Restrictions RLE Weight Bearing: Weight bearing as tolerated Other Position/Activity Restrictions: WBAT per Dr Alfredia Ferguson       Mobility Bed Mobility               General bed mobility comments: seated EOB upon arrival    Transfers Overall transfer level: Needs assistance Equipment used: Rolling walker (2 wheeled) Transfers: Sit to/from Stand Sit to Stand: Min guard         General transfer comment: needs increased time to stand from bed and verbal cues for safe hand placement during sit <> stand    Balance Overall balance assessment: Needs assistance;History of Falls Sitting-balance support: Feet supported Sitting balance-Leahy Scale: Good     Standing balance support: No upper extremity supported Standing balance-Leahy Scale: Fair Standing balance comment: min G static standing at sink                           ADL either performed or assessed  with clinical judgement   ADL Overall ADL's : Needs assistance/impaired     Grooming: Wash/dry face;Wash/dry hands;Min guard;Standing Grooming Details (indicate cue type and reason): mildly unsteady at times standing at sink                 Toilet Transfer: Min guard;Ambulation;Regular Toilet;RW;Cueing for safety Toilet Transfer Details (indicate cue type and reason): poor safety awareness needing cues for hand placement vs pulling on walker Toileting- Clothing Manipulation and Hygiene: Set up;Sitting/lateral lean       Functional mobility during ADLs: Min guard;Cueing for safety;Rolling walker                 Cognition Arousal/Alertness: Awake/alert Behavior During Therapy: WFL for tasks assessed/performed Overall Cognitive Status: Within Functional Limits for tasks assessed                                 General Comments: AxO x 3 very pleasant                   Pertinent Vitals/ Pain       Pain Assessment: Faces Faces Pain Scale: Hurts little more Pain Location: R hip Pain Descriptors / Indicators: Grimacing;Guarding;Discomfort;Sore Pain Intervention(s): Monitored during session;Patient requesting pain meds-RN notified         Frequency  Min 2X/week        Progress Toward Goals  OT Goals(current goals can  now be found in the care plan section)  Progress towards OT goals: Progressing toward goals  Acute Rehab OT Goals Patient Stated Goal: get stronger, quit falling OT Goal Formulation: With patient Time For Goal Achievement: 09/13/20 Potential to Achieve Goals: Good ADL Goals Pt Will Perform Lower Body Dressing: with modified independence;with adaptive equipment;sit to/from stand Pt Will Transfer to Toilet: with modified independence;ambulating;regular height toilet;grab bars Pt Will Perform Toileting - Clothing Manipulation and hygiene: with modified independence;sit to/from stand Additional ADL Goal #1: Patient will stand at  sink to perform grooming task as evidence of improving activity tolerance  Plan Discharge plan remains appropriate       AM-PAC OT "6 Clicks" Daily Activity     Outcome Measure   Help from another person eating meals?: None Help from another person taking care of personal grooming?: A Little Help from another person toileting, which includes using toliet, bedpan, or urinal?: A Little Help from another person bathing (including washing, rinsing, drying)?: A Little Help from another person to put on and taking off regular upper body clothing?: A Little Help from another person to put on and taking off regular lower body clothing?: A Lot 6 Click Score: 18    End of Session Equipment Utilized During Treatment: Rolling walker  OT Visit Diagnosis: Other abnormalities of gait and mobility (R26.89);Pain;Muscle weakness (generalized) (M62.81) Pain - Right/Left: Right Pain - part of body: Hip   Activity Tolerance Patient tolerated treatment well   Patient Left in chair;with call bell/phone within reach;with chair alarm set   Nurse Communication Mobility status        Time: 0943-1000 OT Time Calculation (min): 17 min  Charges: OT General Charges $OT Visit: 1 Visit OT Treatments $Self Care/Home Management : 8-22 mins  Carolyn Drake OT OT pager: [phone]   Carolyn Drake 09/01/2020, 10:53 AM

## 2020-09-01 NOTE — Progress Notes (Signed)
Attempted to call report to [phone] ext [phone]. Voicemail left with my callback number.

## 2020-09-01 NOTE — TOC Transition Note (Signed)
Transition of Care [facility]) - CM/SW Discharge Note   Patient Details  Name: Carolyn Drake MRN: [id number] Date of Birth: 15-Jun-1950  Transition of Care [facility]) CM/SW Contact:  Ross Ludwig, LCSW Phone Number: 09/01/2020, 4:16 PM   Clinical Narrative:     Patient to be d/c'ed today to [facility].  Patient and family agreeable to plans will transport via ems RN to call report to [phone] ext [phone].  CSW left message on daughter Shaniquia's voice mail.  CSW faxed copy of the negative covid test and controlled meds to SNF.  Final next level of care: Skilled Nursing Facility Barriers to Discharge: Barriers Resolved   Patient Goals and CMS Choice Patient states their goals for this hospitalization and ongoing recovery are:: To go to [facility] for shrot term rehab and then return back home. CMS Medicare.gov Compare Post Acute Care list provided to:: Patient Represenative (must comment) Choice offered to / list presented to : Adult Children  Discharge Placement              Patient chooses bed at: [place] Patient to be transferred to facility by: [facility] Name of family member notified: [name], [name] [phone]  [phone]  [name],[name] Daughter [phone]        Documents on File Patient and family notified of of transfer: 09/01/20  Discharge Plan and Services                                     Social Determinants of Health (SDOH) Interventions     Readmission Risk Interventions No flowsheet data found.

## 2020-09-03 LAB — CULTURE, BLOOD (ROUTINE X 2)
Culture: NO GROWTH
Culture: NO GROWTH
Special Requests: ADEQUATE
Special Requests: ADEQUATE

## 2020-09-04 LAB — METHYLMALONIC ACID, SERUM: Methylmalonic Acid, Quantitative: 367 nmol/L (ref 0–378)

## 2020-09-09 MED FILL — Fentanyl Citrate Preservative Free (PF) Inj 100 MCG/2ML: INTRAMUSCULAR | Qty: 2 | Status: AC

## 2020-09-23 ENCOUNTER — Observation Stay
Admission: EM | Admit: 2020-09-23 | Payer: Medicare Other | Source: Other Acute Inpatient Hospital | Admitting: Internal Medicine

## 2020-09-28 ENCOUNTER — Telehealth: Payer: Self-pay | Admitting: *Deleted

## 2020-09-28 NOTE — Telephone Encounter (Signed)
Angie, has patient had any blood work done since May 18th when Hgb resulted as 5.2?

## 2020-09-28 NOTE — Telephone Encounter (Addendum)
Sent request to [facility] and [facility] to obtain records.

## 2020-09-28 NOTE — Telephone Encounter (Signed)
Can we please get any records regarding Hgb status. If her Hgb is 5, she needs to present to the ED. It is unclear if that Hgb was before transfusion or not. I see she has an appt this week with Dr. Gala Romney.

## 2020-09-28 NOTE — Telephone Encounter (Signed)
Received voice mail from Dr. Junius Roads from [facility].  He stated that pt needs to have colonoscopy ASAP.  He said that pt has been seeing him at oncology/hematology clinic.  He said that her HGB was 5.  He is setting up IV and Iron infusions.  He said pt was sent to ER for blood in stool and was given 2 units of blood.  He said that they wanted to transfer pt but she declined and requested to go home.  He said if any questions please call him at [phone] or [phone].    Routing to Roseanne Kaufman, NP and [facility] Clinical Pool.

## 2020-09-28 NOTE — Telephone Encounter (Signed)
Received records from [facility].  Placed in [place], NP's box for review.  Routing as FYI.

## 2020-09-28 NOTE — Telephone Encounter (Signed)
Called Carolyn Drake at [facility] to follow up on release of records.  She said that she will try to fax them over today.  She said she is behind since she was off last week.  Awaiting records.

## 2020-09-29 NOTE — Telephone Encounter (Signed)
Labs reviewed.   May 18th: Hgb 5.2. Iron 28, sats 8, ferritin 61. She presented to the ED at the request of Dr. Junius Roads. There, she received 2 units PRBCs at [facility]. She was to be transferred to [facility] but patient refused and wanted to go home. Reportedly, patient went for Feraheme yesterday 5/23 at outside facility.   Angie, can we have CBC done now so we know where she stands as there was no post-transfusion H/H? Appt upcoming Friday.

## 2020-09-29 NOTE — Telephone Encounter (Signed)
Lmom for pt to call me back. 

## 2020-09-30 ENCOUNTER — Other Ambulatory Visit: Payer: Self-pay | Admitting: *Deleted

## 2020-09-30 ENCOUNTER — Telehealth: Payer: Self-pay | Admitting: Internal Medicine

## 2020-09-30 DIAGNOSIS — D649 Anemia, unspecified: Secondary | ICD-10-CM

## 2020-09-30 NOTE — Telephone Encounter (Signed)
Pt LMOM after hours saying she was returning a call. She has OV today at 9am with RMR.

## 2020-09-30 NOTE — Telephone Encounter (Signed)
Pt called and left voicemail, she said she is going to have the blood work done today and will be at her appointment with [place] on Friday.

## 2020-09-30 NOTE — Telephone Encounter (Signed)
Noted. Thanks.

## 2020-09-30 NOTE — Telephone Encounter (Signed)
Spoke to pt.  She was informed that she needs CBC prior to appt with [place] on Friday.  Pt requested to have lab drawn at [place].  Called [facility].  They do accept outside lab requisitions.  Faxed to their office [phone].

## 2020-09-30 NOTE — Telephone Encounter (Signed)
Spoke to pt.  She was informed that she needs CBC prior to appt with [place] on Friday.  Pt requested to have lab drawn at [place].  Called [facility].  They do accept outside lab requisitions.  Faxed to their office [phone].

## 2020-09-30 NOTE — Telephone Encounter (Signed)
CORRECTION her OV with RMR is on Friday

## 2020-10-02 ENCOUNTER — Telehealth: Payer: Self-pay

## 2020-10-02 ENCOUNTER — Encounter: Payer: Self-pay | Admitting: Internal Medicine

## 2020-10-02 ENCOUNTER — Other Ambulatory Visit: Payer: Self-pay

## 2020-10-02 ENCOUNTER — Ambulatory Visit (INDEPENDENT_AMBULATORY_CARE_PROVIDER_SITE_OTHER): Payer: Medicare Other | Admitting: Internal Medicine

## 2020-10-02 VITALS — BP 108/69 | HR 99 | Temp 97.5°F | Ht 63.0 in | Wt 98.6 lb

## 2020-10-02 DIAGNOSIS — R948 Abnormal results of function studies of other organs and systems: Secondary | ICD-10-CM | POA: Diagnosis not present

## 2020-10-02 DIAGNOSIS — K625 Hemorrhage of anus and rectum: Secondary | ICD-10-CM

## 2020-10-02 DIAGNOSIS — K529 Noninfective gastroenteritis and colitis, unspecified: Secondary | ICD-10-CM

## 2020-10-02 NOTE — Progress Notes (Signed)
Primary Care Physician:  Kendrick Ranch, MD Primary Gastroenterologist:  Dr. Gala Romney  Pre-Procedure History & Physical: HPI:  Carolyn Drake is a 70 y.o. female with multiple comorbidities including stage IV non-small cell lung cancer,  GI bleeding of obscure etiology, collagenous colitis, gastric nodule being followed at [facility]. Patient has had intermittent blood per rectum times several months.  At times has black tarry looking bowel movements; chronic diarrhea in the setting of known collagenous colitis - on no specific treatment.  She has been seen in our office previously for rectal bleeding.  Plans for colonoscopy have been derailed for a variety of reasons, more recently she failed to prep adequately.  On Easter Sunday she was at home standing up and getting a glass of tea and she felt appreciating right hip pain and states her "hip went out" and she fell to the floor.  Ended up with a hip fracture; underwent pinning at [facility].  A few days later, she went to rehab and was noted to have a profound anemia with a hemoglobin of 5.2 and hematocrit of 18.1 MCV 101.  She was transferred to [facility].  She has receivedn IV iron and 2 units of packed RBCs.  On 09/23/2020  her ferritin was 61.5 iron 28 iron binding capacity 339 (from [facility]).  On 09/30/2020 H&H 9.3 and 28.8 MCV 92.3 platelet count 227.  She has been followed by Dr. Junius Roads, oncologist in [place].  Previously on Keytruda but this was discontinued due to nephritis and a bump in her creatinine.  Patient states rectal bleeding has improved somewhat occasionally has dark stools.  Has chronic right lower quadrant abdominal pain-has had the symptoms for years -not affected by eating or bowel function.  No history of appendectomy.  GI history significant for an unremarkable colonoscopy at [facility] 4 years ago.  However,  biopsies revealed collagenous colitis.  She  cannot afford Entocort.  She has been on mesalamine preparations without much improvement;  takes Imodium for symptomatic control of diarrhea.  Prior EGD demonstrated a submucosal gastric nodule.  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  Surveillance EUS scheduled for next year.  Also, history of a small type I choledochal cyst.  Patient denies any upper GI tract symptoms such as odynophagia, dysphagia early satiety or nausea vomiting.  Lower GI tract symptoms as outlined above along with pain.  She has been able to eat.  It is notable she does have GERD as listed in her past medical history and currently is on twice daily pantoprazole.  She denies NSAIDs aside from a low-dose aspirin daily.  She is not anticoagulated.  She does not recall whether or not she is ever had a bone density study or told that she has osteoporosis.  We attempted to screen for celiac disease previously but those labs were never completed.  PET scan reportedly showed increased uptake in the area of the rectum.   Past Medical History:  Diagnosis Date  . Adenocarcinoma of lung, stage 4 ([place]) 09/2018  . Anxiety   . Choledochal cyst    Identified on [place] on 05/27/19. Plans to monitor for 6 months. Consider MRI/MRCP in 6 months or if symptomatic.   . Collagenous colitis 2018  . Colon polyp   . COPD (chronic obstructive pulmonary disease) ([place])   . Depression   . 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  GERD (gastroesophageal reflux disease)   . HLD (hyperlipidemia)   . HTN (hypertension)   . Lesion of stomach    followed by routine EUS, last in 2019; surveillance due in 2023  . Lung cancer ([place]) 2008   s/p resenction  . Myocardial infarction ([place])   . Sleep apnea     Past Surgical History:  Procedure Laterality Date  . ABDOMINAL HYSTERECTOMY    . ANORECTAL MANOMETRY  12/07/2015   Information obtained from Dr. Lissa Morales notes [facility]):  Normal resting anal sphincter pressure with normal squeeze and intact anorectal inhibitory reflex.  Mild rectal hypersensitivity.  Findings characteristic of type III dyssynergia.  Marland Kitchen BLADDER SUSPENSION    . COLONOSCOPY W/ BIOPSIES  08/18/2016   [facility] Anesthesia; Surgeon: Dr. Arsenio Loader; No polyps, mucosal inflammation and mucosa appeared normal. S/p random biopsies to r/o collagenous colitis. Pathology consistent with collagenous colitis. Recommended repeat TCS in 10 years.   . CORONARY STENT PLACEMENT  2006  . ESOPHAGOGASTRODUODENOSCOPY  12/28/2015   [facility]; Surgeon: Dr. Harley [facility]); GAstric subepithelial lesion about 1.5 cm in distal gastric body along greater curvature of the stomach s/p biopsies. Erythematous mucosal fold in duodenal bulb s/p biopsy. Path: benign stomach biopsies , no H. pylori or malignancy, benign duodenal biopsy.   . INGUINAL HERNIA REPAIR    . LUNG LOBECTOMY Right 2009   upper lobe  . UPPER ESOPHAGEAL ENDOSCOPIC ULTRASOUND (EUS)  01/29/2018   [facility]; Surgeon: Dr. Newman Pies [facility]); 1.5 cm subepithelial lesion in gastric antrum consistent with leiomyoma or GIST. Repeat in 4 years for surveillance.   Marland Kitchen UPPER ESOPHAGEAL ENDOSCOPIC ULTRASOUND (EUS)  12/28/2015   [facility]; Surgeon: Dr. Harley [facility]); 1.2cm x 1.6 cm gastric lesion. Leiomyoma vs GIST.   Marland Kitchen UPPER ESOPHAGEAL ENDOSCOPIC ULTRASOUND (EUS)  07/31/2011   Information obtained from Dr. Lissa Morales notes [facility]): 1.5 stomach submucosal lesion in the gastric body  . UPPER ESOPHAGEAL ENDOSCOPIC ULTRASOUND (EUS)  01/16/2013   Information obtained from Dr. Lissa Morales notes [facility]): 15 mm x 12 mm stomach submucosal lesion in the stomach body greater curve-no change in size.    Prior to Admission medications   Medication Sig Start Date End Date Taking? Authorizing Provider  acetaminophen (TYLENOL) 325 MG tablet Take 2 tablets (650 mg total) by mouth every 6 (six) hours as needed for mild pain (or Fever >/=  101). 09/01/20  Yes Sheikh, Omair Latif, DO  albuterol (VENTOLIN HFA) 108 (90 Base) MCG/ACT inhaler Inhale 2 puffs into the lungs every 6 (six) hours as needed for shortness of breath or wheezing. 03/13/19  Yes [provider]  aspirin 81 MG EC tablet Take 81 mg by mouth daily.   Yes [provider]  atorvastatin (LIPITOR) 40 MG tablet Take 40 mg by mouth daily. 04/05/19  Yes [provider]  clonazePAM (KLONOPIN) 0.5 MG tablet Take 1 tablet (0.5 mg total) by mouth daily as needed for anxiety. 09/01/20  Yes Sheikh, Omair Latif, DO  diphenhydrAMINE (BENADRYL) 25 mg capsule Take 25 mg by mouth every 6 (six) hours as needed for allergies.   Yes [provider]  diphenoxylate-atropine (LOMOTIL) 2.5-0.025 MG tablet Take 1 tablet by mouth every 6 (six) hours as needed for diarrhea or loose stools. 03/20/19  Yes [provider]  escitalopram (LEXAPRO) 20 MG tablet Take 20 mg by mouth daily. 08/16/17  Yes [provider]  Ferrous Fumarate (HEMOCYTE - 106 MG FE) 324 (106 Fe) MG TABS tablet Take 1 tablet by mouth 2 (  two) times daily. 07/08/20  Yes [provider]  GABAPENTIN PO Take by mouth. Once daily (unsure dosage)   Yes [provider]  levothyroxine (SYNTHROID) 75 MCG tablet Take 75 mcg by mouth daily before breakfast.   Yes [provider]  Melatonin 10 MG TABS Take 10 mg by mouth at bedtime.   Yes [provider]  nitroGLYCERIN (NITROSTAT) 0.4 MG SL tablet Place 0.4 mg under the tongue every 5 (five) minutes as needed for chest pain.   Yes [provider]  pantoprazole (PROTONIX) 40 MG tablet Take 40 mg by mouth 2 (two) times daily. 11/05/19  Yes [provider]  polyethylene glycol (MIRALAX / GLYCOLAX) 17 g packet Take 17 g by mouth daily as needed for mild constipation.   Yes [provider]  promethazine (PHENERGAN) 25 MG tablet Take 25 mg by mouth every 6 (six) hours as needed for vomiting  or nausea. 02/14/18  Yes [provider]  rizatriptan (MAXALT-MLT) 10 MG disintegrating tablet Take 10 mg by mouth daily as needed for migraine. 04/27/20  Yes [provider]  traZODone (DESYREL) 50 MG tablet Take 50 mg by mouth at bedtime. 04/05/19  Yes [provider]    Allergies as of 10/02/2020 - Review Complete 10/02/2020  Allergen Reaction Noted  . Lisinopril Cough 04/22/2019    Family History  Problem Relation Age of Onset  . Lung cancer Mother   . Congestive Heart Failure Father   . Colon cancer Neg Hx     Social History   Socioeconomic History  . Marital status: Married    Spouse name: Not on file  . Number of children: Not on file  . Years of education: Not on file  . Highest education level: Not on file  Occupational History  . Not on file  Tobacco Use  . Smoking status: Current Every Day Smoker    Packs/day: 0.50    Types: Cigarettes  . Smokeless tobacco: Never Used  Vaping Use  . Vaping Use: Never used  Substance and Sexual Activity  . Alcohol use: Not Currently  . Drug use: Not Currently  . Sexual activity: Not on file  Other Topics Concern  . Not on file  Social History Narrative  . Not on file   Social Determinants of Health   Financial Resource Strain: Not on file  Food Insecurity: Not on file  Transportation Needs: Not on file  Physical Activity: Not on file  Stress: Not on file  Social Connections: Not on file  Intimate Partner Violence: Not on file    Review of Systems: See HPI, otherwise negative ROS  Physical Exam: BP 108/69   Pulse 99   Temp (!) 97.5 F (36.4 C)   Ht _0  (1.6 m)   Wt 98 lb 9.6 oz (44.7 kg)   BMI 17.47 kg/m  General:   Alert,  pleasant and cooperative in NAD; appears somewhat frail. Eyes:  Sclera clear, no icterus.   Conjunctiva pale  Ears:  Normal auditory acuity. Neck:  Supple; no masses or thyromegaly. No significant cervical adenopathy. Lungs:  Clear throughout to  auscultation.   No wheezes, crackles, or rhonchi. No acute distress. Heart:  Regular rate and rhythm; no murmurs, clicks, rubs,  or gallops. Abdomen: Non-distended, normal bowel sounds.  Soft; mild right lower quadrant tenderness to deep palpation.  No appreciable mass organomegaly.  Pulses:  Normal pulses noted. Extremities:  Without clubbing or edema. Rectal: Small external hemorrhoid.  No mass  in the rectal vault.  Rectal vault is empty.  Mucus is Hemoccult negative.   Impression/Plan: Delightful, frail 70 year old lady with stage IV non small cell carcinoma of the lung here to Reassess GI bleeding of obscure etiology.  Recent acute on chronic anemia requiring transfusion.  She has described both melena and rectal bleeding.  PET previously revealed area of concern in the rectum. History of gastric nodule-evaluated previously and felt to be either a leiomyoma or GIST she is on a surveillance protocol.  I suppose this lady could be bleeding anywhere along her GI tract.  Aside from collagenous colitis, her lower GI tract looked good a little over 4 years ago.  History of indolent gastric lesion seen on imaging previously.  I do wonder about her bone density status.  Clinically, it sounds like she suffered a fragility fracture recently.  I agree, further GI evaluation is warranted to help sort out and direct future care.  Recommendations:  To this end, I have offered the patient both a diagnostic colonoscopy and EGD in the near future.  We will utilize the pediatric colonoscope.  The importance of complying with colon preparation emphasized to the patient.  We will utilize propofol; ASA 4.  We will consider performing small bowel biopsy at the time of EGD as feasible/appropriate.  The risks, benefits, limitations, imponderables and alternatives regarding both EGD and colonoscopy have been reviewed with the patient. Questions have been answered. All parties agreeable.   For now, patient  is advised to continue Imodium as needed and pantoprazole.  Further recommendations to follow.     Notice: This dictation was prepared with Dragon dictation along with smaller phrase technology. Any transcriptional errors that result from this process are unintentional and may not be corrected upon review.

## 2020-10-02 NOTE — Telephone Encounter (Signed)
Will call pt to schedule TCS/EGD w/Propofol ASA 4 when Dr. Roseanne Kaufman future schedule is available.

## 2020-10-02 NOTE — Patient Instructions (Signed)
As discussed, we should proceed with a diagnostic upper endoscopy (EGD) and colonoscopy.  For blood in the stools and anemia requiring transfusion.  We will utilize the pediatric colonoscope  We will utilize propofol.  ASA 4  Continue pantoprazole 40 mg twice daily for the time being  Continue Imodium as needed for diarrhea  Will make further recommendations as to management of your GI symptoms after endoscopic evaluation has been completed  It was good to see you in the office today.

## 2020-10-08 ENCOUNTER — Telehealth: Payer: Self-pay | Admitting: *Deleted

## 2020-10-08 NOTE — Telephone Encounter (Signed)
Called pt to schedule TCS/EGD with propofol, ASA 4, Dr. Gala Romney. She was at the doctor and will call back to schedule

## 2020-10-09 ENCOUNTER — Other Ambulatory Visit: Payer: Self-pay

## 2020-10-09 MED ORDER — PEG 3350-KCL-NA BICARB-NACL 420 G PO SOLR: 4000.0000 mL | ORAL | 0 refills | Status: AC

## 2020-10-09 NOTE — Telephone Encounter (Signed)
Pt called office yesterday and LMOVM. Tried to call pt, LMOVM for return call.

## 2020-10-09 NOTE — Telephone Encounter (Signed)
Spoke to pt, TCS/EGD scheduled for 11/20/20 at 10:30am. Rx for prep sent to pharmacy. Orders entered.  No PA needed per [facility] website: BLOCKING - Product does not have notification requirements.

## 2020-10-12 NOTE — Telephone Encounter (Signed)
Pre-op appt 11/18/20. Appt letter mailed with procedure instructions.

## 2020-11-16 NOTE — Patient Instructions (Signed)
Carolyn Drake  11/16/2020     @PREFPERIOPPHARMACY @   Your procedure is scheduled on  11/20/2020.   Report to [facility] at  [place] AM   Call this number if you have problems the morning of surgery:  [phone]   Remember:  Follow the diet and prep instructions given to you by the office.    Take these medicines the morning of surgery with A SIP OF WATER         clonazepam( if needed), lexapro, gabapentin, levothyroxine, protonix, maxalt(if needed).     Do not wear jewelry, make-up or nail polish.  Do not wear lotions, powders, or perfumes, or deodorant.  Do not shave 48 hours prior to surgery.  Men may shave face and neck.  Do not bring valuables to the hospital.  [facility] is not responsible for any belongings or valuables.  Contacts, dentures or bridgework may not be worn into surgery.  Leave your suitcase in the car.  After surgery it may be brought to your room.  For patients admitted to the hospital, discharge time will be determined by your treatment team.  Patients discharged the day of surgery will not be allowed to drive home.    Special instructions:     DO NOT smoke tobacco or vape for 24 hours before your procedure.  Please read over the following fact sheets that you were given. Anesthesia Post-op Instructions and Care and Recovery After Surgery      Upper Endoscopy, Adult, Care After This sheet gives you information about how to care for yourself after your procedure. Your health care provider may also give you more specific instructions. If you have problems or questions, contact your health careprovider. What can I expect after the procedure? After the procedure, it is common to have: A sore throat. Mild stomach pain or discomfort. Bloating. Nausea. Follow these instructions at home:  Follow instructions from your health care provider about what to eat or drink after your procedure. Return to your normal activities as told by your  health care provider. Ask your health care provider what activities are safe for you. Take over-the-counter and prescription medicines only as told by your health care provider. If you were given a sedative during the procedure, it can affect you for several hours. Do not drive or operate machinery until your health care provider says that it is safe. Keep all follow-up visits as told by your health care provider. This is important. Contact a health care provider if you have: A sore throat that lasts longer than one day. Trouble swallowing. Get help right away if: You vomit blood or your vomit looks like coffee grounds. You have: A fever. Bloody, black, or tarry stools. A severe sore throat or you cannot swallow. Difficulty breathing. Severe pain in your chest or abdomen. Summary After the procedure, it is common to have a sore throat, mild stomach discomfort, bloating, and nausea. If you were given a sedative during the procedure, it can affect you for several hours. Do not drive or operate machinery until your health care provider says that it is safe. Follow instructions from your health care provider about what to eat or drink after your procedure. Return to your normal activities as told by your health care provider. This information is not intended to replace advice given to you by your health care provider. Make sure you discuss any questions you have with your healthcare provider. Document Revised: 04/23/2019  Document Reviewed: 09/25/2017 Elsevier Patient Education  2022 [place]. Colonoscopy, Adult, Care After This sheet gives you information about how to care for yourself after your procedure. Your health care provider may also give you more specific instructions. If you have problems or questions, contact your health careprovider. What can I expect after the procedure? After the procedure, it is common to have: A small amount of blood in your stool for 24 hours after the  procedure. Some gas. Mild cramping or bloating of your abdomen. Follow these instructions at home: Eating and drinking  Drink enough fluid to keep your urine pale yellow. Follow instructions from your health care provider about eating or drinking restrictions. Resume your normal diet as instructed by your health care provider. Avoid heavy or fried foods that are hard to digest.  Activity Rest as told by your health care provider. Avoid sitting for a long time without moving. Get up to take short walks every 1-2 hours. This is important to improve blood flow and breathing. Ask for help if you feel weak or unsteady. Return to your normal activities as told by your health care provider. Ask your health care provider what activities are safe for you. Managing cramping and bloating  Try walking around when you have cramps or feel bloated. Apply heat to your abdomen as told by your health care provider. Use the heat source that your health care provider recommends, such as a moist heat pack or a heating pad. Place a towel between your skin and the heat source. Leave the heat on for 20-30 minutes. Remove the heat if your skin turns bright red. This is especially important if you are unable to feel pain, heat, or cold. You may have a greater risk of getting burned.  General instructions If you were given a sedative during the procedure, it can affect you for several hours. Do not drive or operate machinery until your health care provider says that it is safe. For the first 24 hours after the procedure: Do not sign important documents. Do not drink alcohol. Do your regular daily activities at a slower pace than normal. Eat soft foods that are easy to digest. Take over-the-counter and prescription medicines only as told by your health care provider. Keep all follow-up visits as told by your health care provider. This is important. Contact a health care provider if: You have blood in your stool  2-3 days after the procedure. Get help right away if you have: More than a small spotting of blood in your stool. Large blood clots in your stool. Swelling of your abdomen. Nausea or vomiting. A fever. Increasing pain in your abdomen that is not relieved with medicine. Summary After the procedure, it is common to have a small amount of blood in your stool. You may also have mild cramping and bloating of your abdomen. If you were given a sedative during the procedure, it can affect you for several hours. Do not drive or operate machinery until your health care provider says that it is safe. Get help right away if you have a lot of blood in your stool, nausea or vomiting, a fever, or increased pain in your abdomen. This information is not intended to replace advice given to you by your health care provider. Make sure you discuss any questions you have with your healthcare provider. Document Revised: 04/19/2019 Document Reviewed: 11/19/2018 Elsevier Patient Education  [place] After This sheet gives you information about how  to care for yourself after your procedure. Your health care provider may also give you more specific instructions. If you have problems or questions, contact your health careprovider. What can I expect after the procedure? After the procedure, it is common to have: Tiredness. Forgetfulness about what happened after the procedure. Impaired judgment for important decisions. Nausea or vomiting. Some difficulty with balance. Follow these instructions at home: For the time period you were told by your health care provider:     Rest as needed. Do not participate in activities where you could fall or become injured. Do not drive or use machinery. Do not drink alcohol. Do not take sleeping pills or medicines that cause drowsiness. Do not make important decisions or sign legal documents. Do not take care of children on your  own. Eating and drinking Follow the diet that is recommended by your health care provider. Drink enough fluid to keep your urine pale yellow. If you vomit: Drink water, juice, or soup when you can drink without vomiting. Make sure you have little or no nausea before eating solid foods. General instructions Have a responsible adult stay with you for the time you are told. It is important to have someone help care for you until you are awake and alert. Take over-the-counter and prescription medicines only as told by your health care provider. If you have sleep apnea, surgery and certain medicines can increase your risk for breathing problems. Follow instructions from your health care provider about wearing your sleep device: Anytime you are sleeping, including during daytime naps. While taking prescription pain medicines, sleeping medicines, or medicines that make you drowsy. Avoid smoking. Keep all follow-up visits as told by your health care provider. This is important. Contact a health care provider if: You keep feeling nauseous or you keep vomiting. You feel light-headed. You are still sleepy or having trouble with balance after 24 hours. You develop a rash. You have a fever. You have redness or swelling around the IV site. Get help right away if: You have trouble breathing. You have new-onset confusion at home. Summary For several hours after your procedure, you may feel tired. You may also be forgetful and have poor judgment. Have a responsible adult stay with you for the time you are told. It is important to have someone help care for you until you are awake and alert. Rest as told. Do not drive or operate machinery. Do not drink alcohol or take sleeping pills. Get help right away if you have trouble breathing, or if you suddenly become confused. This information is not intended to replace advice given to you by your health care provider. Make sure you discuss any questions you  have with your healthcare provider. Document Revised: 01/09/2020 Document Reviewed: 03/28/2019 Elsevier Patient Education  2022 [employer].

## 2020-11-18 ENCOUNTER — Encounter (HOSPITAL_COMMUNITY): Payer: Self-pay

## 2020-11-18 ENCOUNTER — Encounter (HOSPITAL_COMMUNITY)
Admission: RE | Admit: 2020-11-18 | Discharge: 2020-11-18 | Disposition: A | Discharge status: Home / Self Care | Payer: Medicare Other | Source: Ambulatory Visit | Attending: Internal Medicine | Admitting: Internal Medicine

## 2020-11-18 ENCOUNTER — Other Ambulatory Visit: Payer: Self-pay

## 2020-11-18 DIAGNOSIS — Z01812 Encounter for preprocedural laboratory examination: Secondary | ICD-10-CM | POA: Diagnosis not present

## 2020-11-18 HISTORY — DX: Chronic kidney disease, stage 3 unspecified: N18.30

## 2020-11-18 LAB — COMPREHENSIVE METABOLIC PANEL
ALT: 16 U/L (ref 0–44)
AST: 20 U/L (ref 15–41)
Albumin: 4 g/dL (ref 3.5–5.0)
Alkaline Phosphatase: 128 U/L — ABNORMAL HIGH (ref 38–126)
Anion gap: 10 (ref 5–15)
BUN: 22 mg/dL (ref 8–23)
CO2: 16 mmol/L — ABNORMAL LOW (ref 22–32)
Calcium: 9.3 mg/dL (ref 8.9–10.3)
Chloride: 110 mmol/L (ref 98–111)
Creatinine, Ser: 1.64 mg/dL — ABNORMAL HIGH (ref 0.44–1.00)
GFR, Estimated: 33 mL/min — ABNORMAL LOW (ref >60)
Glucose, Bld: 113 mg/dL — ABNORMAL HIGH (ref 70–99)
Potassium: 3.6 mmol/L (ref 3.5–5.1)
Sodium: 136 mmol/L (ref 135–145)
Total Bilirubin: 0.4 mg/dL (ref 0.3–1.2)
Total Protein: 6.8 g/dL (ref 6.5–8.1)

## 2020-11-18 LAB — CBC WITH DIFFERENTIAL/PLATELET
Abs Immature Granulocytes: 0.01 10*3/uL (ref 0.00–0.07)
Basophils Absolute: 0 10*3/uL (ref 0.0–0.1)
Basophils Relative: 1 %
Eosinophils Absolute: 0.2 10*3/uL (ref 0.0–0.5)
Eosinophils Relative: 4 %
HCT: 37 % (ref 36.0–46.0)
Hemoglobin: 11.7 g/dL — ABNORMAL LOW (ref 12.0–15.0)
Immature Granulocytes: 0 %
Lymphocytes Relative: 33 %
Lymphs Abs: 1.7 10*3/uL (ref 0.7–4.0)
MCH: 31 pg (ref 26.0–34.0)
MCHC: 31.6 g/dL (ref 30.0–36.0)
MCV: 98.1 fL (ref 80.0–100.0)
Monocytes Absolute: 0.4 10*3/uL (ref 0.1–1.0)
Monocytes Relative: 7 %
Neutro Abs: 2.7 10*3/uL (ref 1.7–7.7)
Neutrophils Relative %: 55 %
Platelets: 249 10*3/uL (ref 150–400)
RBC: 3.77 MIL/uL — ABNORMAL LOW (ref 3.87–5.11)
RDW: 14.4 % (ref 11.5–15.5)
WBC: 5 10*3/uL (ref 4.0–10.5)
nRBC: 0 % (ref 0.0–0.2)

## 2020-11-20 ENCOUNTER — Ambulatory Visit (HOSPITAL_COMMUNITY): Payer: Medicare Other | Admitting: Anesthesiology

## 2020-11-20 ENCOUNTER — Encounter (HOSPITAL_COMMUNITY)
Admission: RE | Disposition: A | Discharge status: Home / Self Care | Payer: Self-pay | Source: Home / Self Care | Attending: Internal Medicine

## 2020-11-20 ENCOUNTER — Ambulatory Visit (HOSPITAL_COMMUNITY)
Admission: RE | Admit: 2020-11-20 | Discharge: 2020-11-20 | Disposition: A | Discharge status: Home / Self Care | Payer: Medicare Other | Attending: Internal Medicine | Admitting: Internal Medicine

## 2020-11-20 ENCOUNTER — Encounter (HOSPITAL_COMMUNITY): Payer: Self-pay | Admitting: Internal Medicine

## 2020-11-20 DIAGNOSIS — Z801 Family history of malignant neoplasm of trachea, bronchus and lung: Secondary | ICD-10-CM | POA: Insufficient documentation

## 2020-11-20 DIAGNOSIS — Z955 Presence of coronary angioplasty implant and graft: Secondary | ICD-10-CM | POA: Insufficient documentation

## 2020-11-20 DIAGNOSIS — N183 Chronic kidney disease, stage 3 unspecified: Secondary | ICD-10-CM | POA: Insufficient documentation

## 2020-11-20 DIAGNOSIS — F17210 Nicotine dependence, cigarettes, uncomplicated: Secondary | ICD-10-CM | POA: Diagnosis not present

## 2020-11-20 DIAGNOSIS — Z7951 Long term (current) use of inhaled steroids: Secondary | ICD-10-CM | POA: Insufficient documentation

## 2020-11-20 DIAGNOSIS — K3189 Other diseases of stomach and duodenum: Secondary | ICD-10-CM | POA: Insufficient documentation

## 2020-11-20 DIAGNOSIS — Q2739 Arteriovenous malformation, other site: Secondary | ICD-10-CM | POA: Diagnosis not present

## 2020-11-20 DIAGNOSIS — K921 Melena: Secondary | ICD-10-CM | POA: Diagnosis not present

## 2020-11-20 DIAGNOSIS — I129 Hypertensive chronic kidney disease with stage 1 through stage 4 chronic kidney disease, or unspecified chronic kidney disease: Secondary | ICD-10-CM | POA: Insufficient documentation

## 2020-11-20 DIAGNOSIS — D490 Neoplasm of unspecified behavior of digestive system: Secondary | ICD-10-CM | POA: Diagnosis not present

## 2020-11-20 DIAGNOSIS — Z888 Allergy status to other drugs, medicaments and biological substances status: Secondary | ICD-10-CM | POA: Diagnosis not present

## 2020-11-20 DIAGNOSIS — Z85118 Personal history of other malignant neoplasm of bronchus and lung: Secondary | ICD-10-CM | POA: Diagnosis not present

## 2020-11-20 DIAGNOSIS — Z79890 Hormone replacement therapy (postmenopausal): Secondary | ICD-10-CM | POA: Insufficient documentation

## 2020-11-20 DIAGNOSIS — K31819 Angiodysplasia of stomach and duodenum without bleeding: Secondary | ICD-10-CM | POA: Diagnosis not present

## 2020-11-20 DIAGNOSIS — Z8249 Family history of ischemic heart disease and other diseases of the circulatory system: Secondary | ICD-10-CM | POA: Insufficient documentation

## 2020-11-20 DIAGNOSIS — D509 Iron deficiency anemia, unspecified: Secondary | ICD-10-CM | POA: Diagnosis not present

## 2020-11-20 DIAGNOSIS — K219 Gastro-esophageal reflux disease without esophagitis: Secondary | ICD-10-CM | POA: Diagnosis not present

## 2020-11-20 DIAGNOSIS — Z79899 Other long term (current) drug therapy: Secondary | ICD-10-CM | POA: Insufficient documentation

## 2020-11-20 HISTORY — PX: COLONOSCOPY WITH PROPOFOL: SHX5780

## 2020-11-20 HISTORY — PX: BIOPSY: SHX5522

## 2020-11-20 HISTORY — PX: HOT HEMOSTASIS: SHX5433

## 2020-11-20 HISTORY — PX: ESOPHAGOGASTRODUODENOSCOPY (EGD) WITH PROPOFOL: SHX5813

## 2020-11-20 SURGERY — COLONOSCOPY WITH PROPOFOL
Anesthesia: General

## 2020-11-20 MED ORDER — PROPOFOL 500 MG/50ML IV EMUL
INTRAVENOUS | Status: DC | PRN
  Administered 2020-11-20: 125 ug/kg/min via INTRAVENOUS

## 2020-11-20 MED ORDER — LACTATED RINGERS IV SOLN: INTRAVENOUS | Status: DC

## 2020-11-20 MED ORDER — PROPOFOL 10 MG/ML IV BOLUS
INTRAVENOUS | Status: DC | PRN
  Administered 2020-11-20: 60 mg via INTRAVENOUS
  Administered 2020-11-20: 20 mg via INTRAVENOUS

## 2020-11-20 MED ORDER — LIDOCAINE HCL (CARDIAC) PF 100 MG/5ML IV SOSY
PREFILLED_SYRINGE | INTRAVENOUS | Status: DC | PRN
  Administered 2020-11-20: 50 mg via INTRAVENOUS

## 2020-11-20 MED ORDER — PHENYLEPHRINE HCL (PRESSORS) 10 MG/ML IV SOLN
INTRAVENOUS | Status: DC | PRN
  Administered 2020-11-20 (×3): 80 ug via INTRAVENOUS

## 2020-11-20 NOTE — Discharge Instructions (Signed)
Colonoscopy Discharge Instructions  Read the instructions outlined below and refer to this sheet in the next few weeks. These discharge instructions provide you with general information on caring for yourself after you leave the hospital. Your doctor may also give you specific instructions. While your treatment has been planned according to the most current medical practices available, unavoidable complications occasionally occur. If you have any problems or questions after discharge, call Dr. Gala Romney at [phone]. ACTIVITY You may resume your regular activity, but move at a slower pace for the next 24 hours.  Take frequent rest periods for the next 24 hours.  Walking will help get rid of the air and reduce the bloated feeling in your belly (abdomen).  No driving for 24 hours (because of the medicine (anesthesia) used during the test).   Do not sign any important legal documents or operate any machinery for 24 hours (because of the anesthesia used during the test).  NUTRITION Drink plenty of fluids.  You may resume your normal diet as instructed by your doctor.  Begin with a light meal and progress to your normal diet. Heavy or fried foods are harder to digest and may make you feel sick to your stomach (nauseated).  Avoid alcoholic beverages for 24 hours or as instructed.  MEDICATIONS You may resume your normal medications unless your doctor tells you otherwise.  WHAT YOU CAN EXPECT TODAY Some feelings of bloating in the abdomen.  Passage of more gas than usual.  Spotting of blood in your stool or on the toilet paper.  IF YOU HAD POLYPS REMOVED DURING THE COLONOSCOPY: No aspirin products for 7 days or as instructed.  No alcohol for 7 days or as instructed.  Eat a soft diet for the next 24 hours.  FINDING OUT THE RESULTS OF YOUR TEST Not all test results are available during your visit. If your test results are not back during the visit, make an appointment with your caregiver to find out the  results. Do not assume everything is normal if you have not heard from your caregiver or the medical facility. It is important for you to follow up on all of your test results.  SEEK IMMEDIATE MEDICAL ATTENTION IF: You have more than a spotting of blood in your stool.  Your belly is swollen (abdominal distention).  You are nauseated or vomiting.  You have a temperature over 101.  You have abdominal pain or discomfort that is severe or gets worse throughout the day.   EGD Discharge instructions Please read the instructions outlined below and refer to this sheet in the next few weeks. These discharge instructions provide you with general information on caring for yourself after you leave the hospital. Your doctor may also give you specific instructions. While your treatment has been planned according to the most current medical practices available, unavoidable complications occasionally occur. If you have any problems or questions after discharge, please call your doctor. ACTIVITY You may resume your regular activity but move at a slower pace for the next 24 hours.  Take frequent rest periods for the next 24 hours.  Walking will help expel (get rid of) the air and reduce the bloated feeling in your abdomen.  No driving for 24 hours (because of the anesthesia (medicine) used during the test).  You may shower.  Do not sign any important legal documents or operate any machinery for 24 hours (because of the anesthesia used during the test).  NUTRITION Drink plenty of fluids.  You may  resume your normal diet.  Begin with a light meal and progress to your normal diet.  Avoid alcoholic beverages for 24 hours or as instructed by your caregiver.  MEDICATIONS You may resume your normal medications unless your caregiver tells you otherwise.  WHAT YOU CAN EXPECT TODAY You may experience abdominal discomfort such as a feeling of fullness or "gas" pains.  FOLLOW-UP Your doctor will discuss the results of  your test with you.  SEEK IMMEDIATE MEDICAL ATTENTION IF ANY OF THE FOLLOWING OCCUR: Excessive nausea (feeling sick to your stomach) and/or vomiting.  Severe abdominal pain and distention (swelling).  Trouble swallowing.  Temperature over 101 F (37.8 C).  Rectal bleeding or vomiting of blood.    The nodule in your stomach has not changed.  Your small intestine was slightly inflamed.  Samples were taken.  You had blood blisters in your colon which may have caused some bleeding.  I treated them  I did not see cancer in either end of your GI tract which is good news  Further recommendations to follow pending review of pathology report  At patient request I called Orson Aloe at [phone] -call rolled to voicemail.  Left a message.

## 2020-11-20 NOTE — Op Note (Signed)
[facility] Patient Name: Carolyn Drake Procedure Date: 11/20/2020 10:57 AM MRN: [id number] Date of Birth: 1950-07-11 Attending MD: Norvel Richards , MD CSN: [id number] Age: 70 Admit Type: Outpatient Procedure:                Upper GI endoscopy Indications:              Iron deficiency anemia, Tumor of the GI tract Providers:                Norvel Richards, MD, Gwenlyn Fudge, RN,                            Kristine L. Risa Grill, Technician Referring MD:              Medicines:                Propofol per Anesthesia Complications:            No immediate complications. Estimated Blood Loss:     Estimated blood loss was minimal. Procedure:                Pre-Anesthesia Assessment:                           - Prior to the procedure, a History and Physical                            was performed, and patient medications and                            allergies were reviewed. The patient's tolerance of                            previous anesthesia was also reviewed. The risks                            and benefits of the procedure and the sedation                            options and risks were discussed with the patient.                            All questions were answered, and informed consent                            was obtained. Prior Anticoagulants: The patient has                            taken no previous anticoagulant or antiplatelet                            agents. ASA Grade Assessment: IV - A patient with                            severe systemic disease that is a constant threat  to life. After reviewing the risks and benefits,                            the patient was deemed in satisfactory condition to                            undergo the procedure.                           After obtaining informed consent, the endoscope was                            passed under direct vision. Throughout the                             procedure, the patient's blood pressure, pulse, and                            oxygen saturations were monitored continuously. The                            [id number]) was introduced through the mouth,                            and advanced to the second part of duodenum. The                            upper GI endoscopy was accomplished without                            difficulty. The patient tolerated the procedure                            well. Scope In: 11:08:25 AM Scope Out: 11:13:08 AM Total Procedure Duration: 0 hours 4 minutes 43 seconds  Findings:      The examined esophagus was normal.      1.5 cm submucosal nodule at the lateral margin of the angularis       anteriorly. Appeared somewhat soft to scope palpation. Overlying mucosa       appeared normal. Gastric mucosa otherwise appeared normal. Patent       pylorus.      Examination bulb second third portion revealed very minimal scalloping       of the fold tips. Otherwise mucous appeared entirely normal. Status post       duodenal biopsy Impression:               - Normal esophagus. Gastric nodule?"appears indolent.                           -Subtly abnormal appearing duodenal mucosa of                            uncertain significance?"status post biopsy Moderate Sedation:      Moderate (conscious) sedation was personally administered by an       anesthesia professional. The following parameters  were monitored: oxygen       saturation, heart rate, blood pressure, respiratory rate, EKG, adequacy       of pulmonary ventilation, and response to care. Recommendation:           - Patient has a contact number available for                            emergencies. The signs and symptoms of potential                            delayed complications were discussed with the                            patient. Return to normal activities tomorrow.                            Written discharge instructions were provided  to the                            patient.                           - Resume previous diet.                           - Continue present medications. Follow-up pathology.                           - Return to my office (date not yet determined).                            See colonoscopy report. 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 All rights reserved. The codes documented in this report are preliminary and upon coder  review may  be revised to meet current compliance requirements. Cristopher Estimable. Sherl Yzaguirre, MD Norvel Richards, MD 11/20/2020 11:37:53 AM This report has been signed electronically. Number of Addenda: 0

## 2020-11-20 NOTE — H&P (Signed)
_0 @   Primary Care Physician:  Kendrick Ranch, MD Primary Gastroenterologist:  Dr. Gala Romney  Pre-Procedure History & Physical: HPI:  Carolyn Drake is a 70 y.o. female here for further evaluation of both melena and rectal bleeding via EGD and colonoscopy.  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  Avid uptake on PET in the rectal area history of collagenous colitis.  History of iron deficiency anemia. 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  Previously on a surveillance program.  No prior resection.  Denies dysphagia.  Past Medical History:  Diagnosis Date   Adenocarcinoma of lung, stage 4 ([name]) 09/2018   Anxiety    Choledochal cyst    Identified on [place] on 05/27/19. Plans to monitor for 6 months. Consider MRI/MRCP in 6 months or if symptomatic.    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 Lissa Morales notes [facility]): Normal resting anal sphincter pressure with normal squeeze and intact anorectal inhibitory reflex.  Mild rectal hypersensitivity.  Findings characteristic of type III dyssynergia.   BLADDER SUSPENSION     COLONOSCOPY W/ BIOPSIES  08/18/2016   [facility] Anesthesia; Surgeon: Dr. Arsenio Loader; No polyps, mucosal inflammation and mucosa appeared normal. S/p random biopsies to r/o collagenous colitis. Pathology consistent with collagenous colitis. Recommended repeat TCS in 10  years.    CORONARY STENT PLACEMENT  2006   ESOPHAGOGASTRODUODENOSCOPY  12/28/2015   [facility] Anesthesia; Surgeon: Dr. Harley [facility]); GAstric subepithelial lesion about 1.5 cm in distal gastric body along greater curvature of the stomach s/p biopsies. Erythematous mucosal fold in duodenal bulb s/p biopsy. Path: benign stomach biopsies , no H. pylori or malignancy, benign duodenal biopsy.    INGUINAL HERNIA REPAIR     LUNG LOBECTOMY Right 2009   upper lobe   UPPER ESOPHAGEAL ENDOSCOPIC ULTRASOUND (EUS)  01/29/2018   [facility]; [facility] 1.5 cm subepithelial lesion in gastric antrum consistent with leiomyoma or GIST. Repeat in 4 years for surveillance.    UPPER ESOPHAGEAL ENDOSCOPIC ULTRASOUND (EUS)  12/28/2015   [facility]esthesia; Surgeon: Dr. Harley [facility]); 1.2cm x 1.6 cm gastric lesion. Leiomyoma vs GIST.    UPPER ESOPHAGEAL ENDOSCOPIC ULTRASOUND (EUS)  07/31/2011   Information obtained from Dr. Lissa Morales notes [facility]): 1.5 stomach submucosal lesion in the gastric body   UPPER ESOPHAGEAL ENDOSCOPIC ULTRASOUND (EUS)  01/16/2013   Information obtained from Dr. Lissa Morales notes [facility]): 15 mm x 12 mm stomach submucosal lesion in the stomach body greater curve-no change in size.    Prior to Admission medications   Medication Sig Start Date End Date Taking? Authorizing Provider  albuterol (VENTOLIN HFA) 108 (90 Base) MCG/ACT inhaler Inhale 2 puffs into the lungs every 6 (six) hours as needed for shortness of breath or wheezing. 03/13/19  Yes [provider]  aspirin 81  MG EC tablet Take 81 mg by mouth daily.   Yes [provider]  atorvastatin (LIPITOR) 40 MG tablet Take 40 mg by mouth daily. 04/05/19  Yes [provider]  carboxymethylcellulose (REFRESH PLUS) 0.5 % SOLN Place 1 drop into both eyes 3 (three) times daily as needed (dry eyes).   Yes [provider]  clonazePAM (KLONOPIN) 0.5 MG tablet Take 1 tablet (0.5 mg total) by mouth daily  as needed for anxiety. 09/01/20  Yes Sheikh, Omair Latif, DO  diphenhydrAMINE (BENADRYL) 25 mg capsule Take 25 mg by mouth every 6 (six) hours as needed for allergies.   Yes [provider]  diphenoxylate-atropine (LOMOTIL) 2.5-0.025 MG tablet Take 1 tablet by mouth every 6 (six) hours as needed for diarrhea or loose stools. 03/20/19  Yes [provider]  escitalopram (LEXAPRO) 20 MG tablet Take 20 mg by mouth daily. 08/16/17  Yes [provider]  gabapentin (NEURONTIN) 100 MG capsule Take 100 mg by mouth 2 (two) times daily as needed (pain).   Yes [provider]  hydrocortisone 2.5 % cream Apply 1 application topically 2 (two) times daily as needed (Hemorids).   Yes [provider]  levothyroxine (SYNTHROID) 75 MCG tablet Take 75 mcg by mouth daily before breakfast.   Yes [provider]  Melatonin 10 MG TABS Take 10 mg by mouth at bedtime.   Yes [provider]  nitroGLYCERIN (NITROSTAT) 0.4 MG SL tablet Place 0.4 mg under the tongue every 5 (five) minutes as needed for chest pain.   Yes [provider]  pantoprazole (PROTONIX) 40 MG tablet Take 40 mg by mouth 2 (two) times daily. 11/05/19  Yes [provider]  promethazine (PHENERGAN) 25 MG tablet Take 25 mg by mouth every 6 (six) hours as needed for vomiting or nausea. 02/14/18  Yes [provider]  rizatriptan (MAXALT-MLT) 10 MG disintegrating tablet Take 10 mg by mouth daily as needed for migraine. 04/27/20  Yes [provider]  traZODone (DESYREL) 50 MG tablet Take 50 mg by mouth at bedtime. 04/05/19  Yes [provider]  acetaminophen (TYLENOL) 325 MG tablet Take 2 tablets (650 mg total) by mouth every 6 (six) hours as needed for mild pain (or Fever >/= 101). Patient not taking: No sig reported 09/01/20   Carolyn Noble Latif, DO  polyethylene glycol-electrolytes (TRILYTE) 420 g solution Take 4,000 mLs by mouth as directed. 10/09/20   Daneil Dolin, MD    Allergies as of 10/09/2020 - Review Complete 10/02/2020  Allergen Reaction Noted   Lisinopril Cough 04/22/2019    Family History  Problem Relation Age of Onset   Lung cancer Mother    Congestive Heart Failure Father    Colon cancer Neg Hx     Social History   Socioeconomic History   Marital status: Married    Spouse name: Not on file   Number of children: Not on file   Years of education: Not on file   Highest education level: Not on file  Occupational History   Not on file  Tobacco Use   Smoking status: Every Day    Packs/day: 0.50    Years: 50.00    Pack years: 25.00    Types: Cigarettes   Smokeless tobacco: Never  Vaping Use   Vaping Use: Never used  Substance and Sexual Activity   Alcohol use: Not Currently   Drug use: Not Currently   Sexual activity: Not on file  Other Topics Concern  Not on file  Social History Narrative   Not on file   Social Determinants of Health   Financial Resource Strain: Not on file  Food Insecurity: Not on file  Transportation Needs: Not on file  Physical Activity: Not on file  Stress: Not on file  Social Connections: Not on file  Intimate Partner Violence: Not on file    Review of Systems: See HPI, otherwise negative ROS  Physical Exam: There were no vitals taken for this visit. General:   Alert,  Well-developed, well-nourished, pleasant and cooperative in NAD Mouth:  No deformity or lesions. Neck:  Supple; no masses or thyromegaly. No significant cervical adenopathy. Lungs:  Clear throughout to auscultation.   No wheezes, crackles, or rhonchi. No acute distress. Heart:  Regular rate and rhythm; no murmurs, clicks, rubs,  or gallops. Abdomen: Non-distended, normal bowel sounds.  Soft and nontender without appreciable mass or hepatosplenomegaly.  Pulses:  Normal pulses noted. Extremities:  Without clubbing or edema.  Impression/Plan: 70 year old lady with adenocarcinoma of the lung stage IV with recent  melena and rectal bleeding.  Iron deficiency anemia.  History of collagenous colitis.  No Prior celiac screen.   History of an indolent gastric lesion recently surveilled via EGD.  No prior surgery. Increased PET uptake in the rectum.  No dysphagia.     Recommendations: I have offered the patient both an EGD and a colonoscopy The risks, benefits, limitations, imponderables and alternatives regarding both EGD and colonoscopy have been reviewed with the patient. Questions have been answered. All parties agreeable.      Notice: This dictation was prepared with Dragon dictation along with smaller phrase technology. Any transcriptional errors that result from this process are unintentional and may not be corrected upon review.

## 2020-11-20 NOTE — Anesthesia Preprocedure Evaluation (Signed)
Anesthesia Evaluation  Patient identified by MRN, date of birth, ID band Patient awake    Reviewed: Allergy & Precautions, H&P , NPO status , Patient's Chart, lab work & pertinent test results, reviewed documented beta blocker date and time   Airway Mallampati: II  TM Distance: >3 FB Neck ROM: full    Dental no notable dental hx.    Pulmonary asthma , sleep apnea , COPD, Current Smoker,    Pulmonary exam normal breath sounds clear to auscultation       Cardiovascular Exercise Tolerance: Good hypertension, + Past MI   Rhythm:regular Rate:Normal     Neuro/Psych PSYCHIATRIC DISORDERS Anxiety Depression negative neurological ROS     GI/Hepatic Neg liver ROS, GERD  Medicated,  Endo/Other  negative endocrine ROS  Renal/GU CRFRenal disease  negative genitourinary   Musculoskeletal   Abdominal   Peds  Hematology  (+) Blood dyscrasia, anemia ,   Anesthesia Other Findings   Reproductive/Obstetrics negative OB ROS                             Anesthesia Physical Anesthesia Plan  ASA: 3  Anesthesia Plan: General   Post-op Pain Management:    Induction:   PONV Risk Score and Plan: Propofol infusion  Airway Management Planned:   Additional Equipment:   Intra-op Plan:   Post-operative Plan:   Informed Consent: I have reviewed the patients History and Physical, chart, labs and discussed the procedure including the risks, benefits and alternatives for the proposed anesthesia with the patient or authorized representative who has indicated his/her understanding and acceptance.     Dental Advisory Given  Plan Discussed with: CRNA  Anesthesia Plan Comments:         Anesthesia Quick Evaluation

## 2020-11-20 NOTE — Transfer of Care (Signed)
Immediate Anesthesia Transfer of Care Note  Patient: Carolyn Drake  Procedure(s) Performed: COLONOSCOPY WITH PROPOFOL ESOPHAGOGASTRODUODENOSCOPY (EGD) WITH PROPOFOL BIOPSY HOT HEMOSTASIS (ARGON PLASMA COAGULATION/BICAP)  Patient Location: Short Stay  Anesthesia Type:General  Level of Consciousness: awake and alert   Airway & Oxygen Therapy: Patient Spontanous Breathing  Post-op Assessment: Report given to RN and Post -op Vital signs reviewed and stable  Post vital signs: Reviewed and stable  Last Vitals:  Vitals Value Taken Time  BP    Temp    Pulse    Resp    SpO2      Last Pain:  Vitals:   11/20/20 1030  PainSc: 0-No pain         Complications: No notable events documented.

## 2020-11-20 NOTE — Anesthesia Postprocedure Evaluation (Signed)
Anesthesia Post Note  Patient: Carolyn Drake  Procedure(s) Performed: COLONOSCOPY WITH PROPOFOL ESOPHAGOGASTRODUODENOSCOPY (EGD) WITH PROPOFOL BIOPSY HOT HEMOSTASIS (ARGON PLASMA COAGULATION/BICAP)  Patient location during evaluation: Phase II Anesthesia Type: General Level of consciousness: awake Pain management: pain level controlled Vital Signs Assessment: post-procedure vital signs reviewed and stable Respiratory status: spontaneous breathing and respiratory function stable Cardiovascular status: blood pressure returned to baseline and stable Postop Assessment: no headache and no apparent nausea or vomiting Anesthetic complications: no Comments: Late entry   No notable events documented.   Last Vitals:  Vitals:   11/20/20 1030 11/20/20 1141  BP: 105/66 (!) 108/56  Pulse: 76 79  Resp: 14 16  Temp: 36.5 C 36.4 C  SpO2: 100% 98%    Last Pain:  Vitals:   11/20/20 1141  TempSrc: Oral  PainSc: 0-No pain                 Louann Sjogren

## 2020-11-20 NOTE — Op Note (Signed)
[facility] Patient Name: Carolyn Drake Procedure Date: 11/20/2020 11:15 AM MRN: [id number] Date of Birth: [birth year] Attending MD: Norvel Richards , MD CSN: [id number] Age: 70 Admit Type: Outpatient Procedure:                Colonoscopy Indications:              Hematochezia Providers:                Norvel Richards, MD, Gwenlyn Fudge, RN,                            Suzan Garibaldi. Risa Grill, Technician Referring MD:              Medicines:                Propofol per Anesthesia Complications:            No immediate complications. Estimated Blood Loss:     Estimated blood loss: none. Procedure:                Pre-Anesthesia Assessment:                           - Prior to the procedure, a History and Physical                            was performed, and patient medications and                            allergies were reviewed. The patient's tolerance of                            previous anesthesia was also reviewed. The risks                            and benefits of the procedure and the sedation                            options and risks were discussed with the patient.                            All questions were answered, and informed consent                            was obtained. Prior Anticoagulants: The patient has                            taken no previous anticoagulant or antiplatelet                            agents. ASA Grade Assessment: IV - A patient with                            severe systemic disease that is a constant threat  to life. After reviewing the risks and benefits,                            the patient was deemed in satisfactory condition to                            undergo the procedure.                           After obtaining informed consent, the colonoscope                            was passed under direct vision. Throughout the                            procedure, the patient's blood pressure,  pulse, and                            oxygen saturations were monitored continuously. The                            [id number]-HQ190L ([id number]) scope was introduced through                            the anus and advanced to the 10 cm into the ileum.                            The colonoscopy was performed without difficulty.                            The patient tolerated the procedure well. The                            quality of the bowel preparation was adequate. The                            ileocecal valve, appendiceal orifice, and rectum                            were photographed. The entire colon was well                            visualized. Scope In: 11:19:17 AM Scope Out: 11:34:06 AM Scope Withdrawal Time: 0 hours 9 minutes 25 seconds  Total Procedure Duration: 0 hours 14 minutes 49 seconds  Findings:      The perianal and digital rectal examinations were normal. Rectal mucosa       seen well?"on face and retroflex. Normal-appearing mucosa. (3)       particularly vascular. AVMs proximately 5 mm in dimensions?"1 in the       descending and 2 in the ascending segments. The remainder the colonic       mucosa appeared normal; the distal 10 cm of terminal ileum appeared       normal      Utilize the APC circular probe  on right colon settings to 3 AVMs were       ablated multiple applications 20 J each. Good hemostasis maintained. No       apparent complication. 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 The remainder of                            the colon, rectum and terminal ileum appeared                            normal. Moderate Sedation:      Moderate (conscious) sedation was personally administered by an       anesthesia professional. The following parameters were monitored: oxygen       saturation, heart rate, blood pressure, respiratory rate, EKG, adequacy       of pulmonary  ventilation, and response to care. Recommendation:           - Patient has a contact number available for                            emergencies. The signs and symptoms of potential                            delayed complications were discussed with the                            patient. Return to normal activities tomorrow.                            Written discharge instructions were provided to the                            patient.                           - Advance diet as tolerated. Continue Lomotil as                            needed for diarrhea. See EGD report. Procedure Code(s):        --- Professional ---                           [phone], Colonoscopy, flexible; diagnostic, including                            collection of specimen(s) by brushing or washing,                            when performed (separate procedure) Diagnosis Code(s):        --- Professional ---  K92.1, Melena (includes Hematochezia) CPT copyright 2019 American Medical Association. All rights reserved. The codes documented in this report are preliminary and upon coder review may  be revised to meet current compliance requirements. Cristopher Estimable. Tahlor Berenguer, MD Norvel Richards, MD 11/20/2020 11:44:11 AM This report has been signed electronically. Number of Addenda: 0

## 2020-11-23 LAB — SURGICAL PATHOLOGY

## 2020-11-27 ENCOUNTER — Encounter (HOSPITAL_COMMUNITY): Payer: Self-pay | Admitting: Internal Medicine

## 2020-12-11 ENCOUNTER — Other Ambulatory Visit: Payer: Self-pay | Admitting: *Deleted

## 2020-12-11 ENCOUNTER — Telehealth: Payer: Self-pay | Admitting: Internal Medicine

## 2020-12-11 DIAGNOSIS — R109 Unspecified abdominal pain: Secondary | ICD-10-CM

## 2020-12-11 DIAGNOSIS — K529 Noninfective gastroenteritis and colitis, unspecified: Secondary | ICD-10-CM

## 2020-12-11 NOTE — Telephone Encounter (Signed)
Called pt back.  She wanted me to review results with her daughter.  Reviewed accordingly.

## 2020-12-11 NOTE — Telephone Encounter (Signed)
Patient called for nurse. Asking for Levada Dy. [phone]

## 2021-01-04 NOTE — Progress Notes (Signed)
Rourk, Cristopher Estimable, MD  Metro Kung, RMA Lets make sure outside labs are scanned in.  I believe that I reviewed them.    Routing to Zeb Comfort to ensure that labs are scanned in.

## 2022-07-27 IMAGING — NM NM GI BLOOD LOSS
2 series · 12 of 12 positions shown · non-contrast
Comparison: Abdominal ultrasound 05/27/2019

CLINICAL DATA: 69-year-old with GI bleed.  Left abdominal pain.

EXAM:
NUCLEAR MEDICINE GASTROINTESTINAL BLEEDING SCAN
TECHNIQUE: Sequential abdominal images were obtained following intravenous
administration of Yc-00m labeled red blood cells.
RADIOPHARMACEUTICALS:  24.3 mCi Yc-00m pertechnetate in-vitro
labeled red cells.

[Series 1: gi bleed · 3.28mm/px · 6 of 60 frames shown]
[frame 6/60]
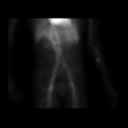
[frame 16/60]
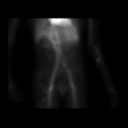
[frame 26/60]
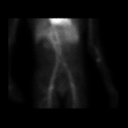
[frame 36/60]
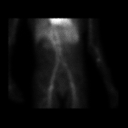
[frame 46/60]
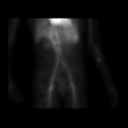
[frame 56/60]
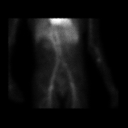

[Series 1: gi bleed 2nd hr · 3.28mm/px · 6 of 60 frames shown]
[frame 6/60]
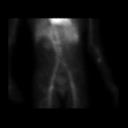
[frame 16/60]
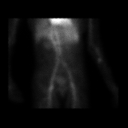
[frame 26/60]
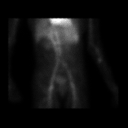
[frame 36/60]
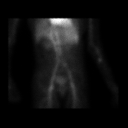
[frame 46/60]
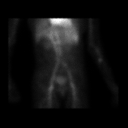
[frame 56/60]
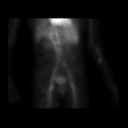

[12 of 12 positions shown; findings below may reference images not displayed]

FINDINGS: Expected uptake in the vascular structures. No uptake within the GI
tract.
IMPRESSION: Negative for active GI bleeding.

## 2023-07-08 DEATH — deceased
